# Patient Record
Sex: Male | Born: 1955 | Race: White
Health system: Northeastern US, Academic
[De-identification: ages and names within clinical notes are randomized; demographics above are authoritative.]

## PROBLEM LIST (undated history)

## (undated) DIAGNOSIS — E785 Hyperlipidemia, unspecified: Secondary | ICD-10-CM

## (undated) DIAGNOSIS — I6521 Occlusion and stenosis of right carotid artery: Secondary | ICD-10-CM

## (undated) HISTORY — DX: Occlusion and stenosis of right carotid artery: I65.21

## (undated) HISTORY — DX: Hyperlipidemia, unspecified: E78.5

---

## 2008-01-22 HISTORY — PX: INGUINAL HERNIA REPAIR: SHX194

## 2010-05-22 ENCOUNTER — Ambulatory Visit: Payer: Self-pay | Admitting: Physical Medicine and Rehabilitation

## 2010-05-22 ENCOUNTER — Encounter: Payer: Self-pay | Admitting: Gastroenterology

## 2010-05-22 NOTE — Miscellaneous (Unsigned)
 Continuity of Care Record  Created: todo  From: Roylene Reason  From:   From: [employer], EHR v10.2.7.53  To: Dirr, Travaris  Purpose: Patient Use;       Medications  Fluocinonide-E 0.05 % Cream ; RPT   Lipitor 10 MG Tablet ; RPT   Testosterone Cypionate 200 MG/ML Oil ; RPT   Viagra 100 MG Tablet ; RPT

## 2010-06-04 ENCOUNTER — Ambulatory Visit: Payer: Self-pay | Admitting: Physical Medicine and Rehabilitation

## 2010-06-04 ENCOUNTER — Encounter: Payer: Self-pay | Admitting: Physical Medicine and Rehabilitation

## 2010-06-04 VITALS — BP 133/83 | HR 86 | Ht 68.0 in | Wt 160.0 lb

## 2010-06-04 DIAGNOSIS — M5412 Radiculopathy, cervical region: Secondary | ICD-10-CM

## 2010-06-04 NOTE — Progress Notes (Signed)
 Dictated

## 2010-06-04 NOTE — Letter (Signed)
 Jun 04, 2010        RE:   Alejandro Dennis, Alejandro Dennis  DOB:  [birth year]  Unit#: [id number]    CHIEF COMPLAINT:  Left-sided neck pain and index finger tingling.    HISTORY OF PRESENT ILLNESS:  [name] is a 55 year old gentlemen with a  history of left index finger numbness and tingling that came on  approximately January 21, 2010, without specific precipitating event.  He  describes some pain into the left scapular region and into the neck.  The  pain travels into the shoulder and skips the forearm and goes with numbness  and tingling into the index finger.  Symptoms are worse when he is looking  down, especially when he his driving or working at the computer.  He states  he has been doing pull-ups which also increases pain.  He has had 8 weeks  of physical therapy.  He has tried traction which has been helpful.  Despite therapy, his symptoms still come and go.  He denies any right-sided  symptoms.    PAST MEDICAL HISTORY:  Notable for hypercholesterolemia.    MEDICATIONS:  Lipitor and occasional Advil.    SOCIAL HISTORY/FUNCTIONAL HISTORY:  The patient is married with children.  He is self-employed in [employer].  He does not smoke.  He drinks  occasional alcohol and an occasional cigar 2 to 3 times a year.    FAMILY HISTORY:  Positive for cancer.    REVIEW OF SYSTEMS:  Positive for left neck, arm, and finger pain and  numbness and tingling.    PHYSICAL EXAMINATION:  The patient is 68 inches tall and weighs 160 pounds.  Blood pressure 133/83.  Pulse 86.  Cervical range of motion is functional  in all planes except for reproduction of left cervical radicular symptoms  with flexion.  Spurling maneuver is mildly positive on the left.  Neurological exam is intact with respect to strength and reflexes of both  upper extremities.  There is no atrophy noted.    IMAGING STUDIES:  None available.    IMPRESSION/PLAN:  [name] is a very pleasant 55 year old gentleman with  left cervical radicular symptoms.  He has persistent  symptoms despite an  8-week course of physical therapy.  I would like to further evaluate with  cervical spine x-rays and a cervical MRI at this point in time.  The  patient will follow up with me after imaging.        Sincerely,              Electronically Signed and Finalized by  Alejandro Bellini, MD 06/04/2010 11:46  ____________________________________  Alejandro Bellini, MD        DD:   06/04/2010  DT:   06/04/2010 11:09 A  YN/WG9#[id number]  [id number]    cc:   Jetty Peeks, MD

## 2010-06-25 ENCOUNTER — Encounter: Payer: Self-pay | Admitting: Physical Medicine and Rehabilitation

## 2010-06-25 ENCOUNTER — Ambulatory Visit: Payer: Self-pay | Admitting: Physical Medicine and Rehabilitation

## 2010-06-25 VITALS — BP 151/77 | HR 86 | Ht 68.0 in | Wt 162.0 lb

## 2010-06-25 DIAGNOSIS — M5412 Radiculopathy, cervical region: Secondary | ICD-10-CM

## 2010-06-25 HISTORY — DX: Radiculopathy, cervical region: M54.12

## 2010-06-25 MED ORDER — GABAPENTIN 300 MG PO CAPS
300.0000 mg | ORAL_CAPSULE | Freq: Every evening | ORAL | Status: DC
Start: 2010-06-25 — End: 2011-07-22

## 2010-06-25 NOTE — Progress Notes (Signed)
 Addended by: Renard Matter on: 06/25/2010 09:45 AM     Modules accepted: Level of Service, SmartSet

## 2010-06-25 NOTE — Letter (Signed)
 June 25, 2010        RE:   Alejandro, Dennis  DOB:  09/01/1955  Unit#: [id number]    HISTORY OF PRESENT ILLNESS:  Alejandro Dennis is here today for a follow-up visit  to review cervical spine x-rays and MRI performed since the last visit.    IMAGING STUDIES:  Imaging was notable for multilevel degenerative changes  throughout the cervical spine with anterior and posterior disc bulge  osteophyte complexes, bilateral facet and uncovertebral hypertrophy, and  disc desiccation.  At the C6-C7 level there was left-sided severe neural  foraminal narrowing and severe at C5-C6 and C4-C5.  There was also  right-sided narrowing as well moderate C6-C7, moderate C5-C6, and mild  C4-C5.  Cervical spine radiographs showed mild discogenic disc disease in  the lower cervical spine.    IMPRESSION:  Alejandro Dennis continues to have left index finger tingling.  He  continues to do gym exercises that do irritate his symptoms.  He just takes  over-the-counter Advil.    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  His symptoms flared up when doing an intense  workout routine back in the fall.  I am recommending a revised course of  physical therapy with a cervical stabilization exercise program.  The  patient will also be given a trial of gabapentin 300 mg p.o. q.h.s.  A  follow-up visit will be in 4 to 6 weeks.        Sincerely,              Electronically Signed and Finalized by  Alejandro Bellini, MD 06/25/2010 14:09  ____________________________________  Alejandro Bellini, MD        DD:   06/25/2010  DT:   06/25/2010 11:33 A  ZO/XW9#[id number]  [id number]    cc:   Marylynn Pearson, MD

## 2010-06-25 NOTE — Progress Notes (Signed)
 Dictated

## 2010-07-18 ENCOUNTER — Telehealth: Payer: Self-pay | Admitting: Physical Medicine and Rehabilitation

## 2010-07-18 ENCOUNTER — Encounter: Payer: Self-pay | Admitting: Gastroenterology

## 2010-07-18 MED ORDER — METHYLPREDNISOLONE 4 MG PO KIT *A*
PACK | ORAL | Status: DC
Start: 2010-07-18 — End: 2011-07-22

## 2010-07-18 NOTE — Telephone Encounter (Signed)
Patient with increased cervical radicular pain over weekend with difficulty tolerating PT. Will order Medrol Pak for cervical foraminal stenosis with radiculopathy. Pt to f/u next week

## 2011-07-22 ENCOUNTER — Encounter: Payer: Self-pay | Admitting: Physical Medicine and Rehabilitation

## 2011-07-22 ENCOUNTER — Ambulatory Visit: Payer: Self-pay | Admitting: Physical Medicine and Rehabilitation

## 2011-07-22 VITALS — BP 147/99 | HR 83 | Ht 68.0 in | Wt 162.0 lb

## 2011-07-22 DIAGNOSIS — M5412 Radiculopathy, cervical region: Secondary | ICD-10-CM

## 2011-07-22 NOTE — Progress Notes (Signed)
Chief complaint: Neck and Left Arm Pain    History of present illness:Alejandro Dennis is a 56 y.o. male with complaints of neck and left arm pain in a C6/7 distribution x 1 year.  The patient denies any antecedent trauma or injury.  The pain started up in his upper trapezius gradually began to radiate in to his triceps lateral forearm and into his index finger.  He denies any permanent numbness but has a dysesthetic sensation into the tip of his finger.  Additionally as he was actively exercising at the onset of this did notice some high-level weakness with pull-ups.    Exacerbating factors: flexion, rotation to the left     Relieving factors:massage, stretching his right arm in adduction, advil    Prior treatment:gabapentin - no change,Acupuncture, massage, physical therapy, Flexeril, Advil    Review of Systems:    Constitutional        []  Fever        []  Chills        []  Loss of Appetite         []  Unexplained Weight Loss        []  Unusual Tiredness        []  Night Pains    Ears, Nose, Mouth, Throat        []  Dizziness        []  Mouth Sores        []  Sore Throat    Cardiovascular        []  High Blood                    Pressure        [x]  Hyperlipidemia        []  Trouble              Breathing         []  Trouble Breathing When Flat        []  Ankle Swelling        []  Heart Attack        []  Congestive Heart Failure        []  Mitral Valve Prolapse        []  Abnormal Heart Rhythm        []  Heart Murmur    Respiratory        []  Heavy Cough        []  Cough Up Sputum        []  Cough Up Blood         []  Pneumonia        []  Asthma  Neurological        []  Fainting        []  Epilepsy        []  Stroke         []  Memory Problems  Eyes        []  Abnormal Vision        []  Glasses or Contacts    Gastrointestinal        []  Nausea        []  Stomach Pain        []  Vomiting         []  Vomiting Blood        []  Ulcers        []  Hiatal Hernia        []  Constipation        []  Diarrhea        []  Change in Bowel Habits        []   Blood in  Stool        []  Black, Tarry Stools        []  Reflux        []  GERD    Genitourinary        []  Painful Urination        []  Impotence        []  One Kidney         []  Kidney Failure        []  Dialysis        []  Kidney Transplant        []  Change in Bladder Habits        []  Incontinence        []  Prostate Cancer    Musculoskeletal        []  Painful Joints        []  Swollen Joints        []  Redness of Joints        []  Joint Infection        []  Bone Infection        []  Gout        []  Osteoarthritis        []  Rheumatoid Arthritis        []  Ankylosing Spondylitis        []  Osteoporosis    Skin        []  Skin Sores        []  Skin Rash        []  Itching        []  Skin Cancer  Psychiatric        []  Depression        []  Anxiety    Endocrine        []  Diabetes        []  Thyroid Problem    Hematologic/Lymphatic        []  Unusual Bleeding        []  Easy Bruising        []  Mass        []  Swollen Glands        []  Anemia        []  Infection        []  Immunodeficiency        []  Hepatitis        []  Cancer    Physical Examination:  General:  Pleasant, straightforward, healthy appearing individual.  Mental Status:  Oriented to person, place, and time. Displays appropriate mood and affect.  Gait:  Gait is normal.  Strength Testing:  Bilateral upper  extremity strength is normal and symmetric. No atrophy or tone abnormalities noted.  Sensation:  Normal, No loss of sensation.  Neuro:  Bilateral upper extremity coordination and reflexes are physiologic and symmetric. Negative Hoffman's response bilaterally.  Spine ROM:  He is reduced cervical extension and left rotation with a forward head posture  Peripheral Joint ROM:  Peripheral joint range of motion is full and pain free without obvious instability or laxity in the upper extremities.  Provocative Maneuvers:  Shoulder provocative maneuvers are negative.  Spurling's maneuver and/or Foramen Compression Maneuvers:  Spurling's maneuver is Positive for radicular pain Into his neck and left  scapular region.  Palpation:  Inspection and palpatory examination of the cervical spine, neck, and upper extremities is Remarkable for tenderness of the left upper trapezius and. No lymphadenopathy in the head or neck.  Peripheral Vascular:  Radial pulses are intact. No obvious extremity swellling in the  upper extremities.  Appearance of Skin:  Skin inspection of the neck and upper lower extremities is unremarkable for this issue.  Special Testing:  None.    Diagnostic testing and results:MRI of the cervical spine demonstrates Severe left C7 foraminal narrowing and bilateral C6 foraminal narrowing at severe    Impression: Neck and left arm pain x1 year with bilateral C6 and left C7 foraminal    Plan:  I reviewed the options with the patient in detail.  The patient has undergone an extensive course of conservative care.  He's not improved.  I suggested we proceed with an EMG nerve conduction study left upper extremity to clarify the distribution and degree of reinnervation attending.  Additionally I will have the patient see Dr. Orson Aloe for surgical consultation.  I think he has maximized his conservative care.  He can choose to continue with his advil and give it more time but with the lack of significant progress over the last year I am not sure that this will resolve the issue.

## 2011-08-23 ENCOUNTER — Encounter: Payer: Self-pay | Admitting: Physical Medicine and Rehabilitation

## 2011-08-23 ENCOUNTER — Ambulatory Visit: Payer: Self-pay | Admitting: Physical Medicine and Rehabilitation

## 2011-08-23 ENCOUNTER — Encounter: Payer: Self-pay | Admitting: Gastroenterology

## 2011-08-23 VITALS — BP 154/88 | HR 91 | Ht 68.0 in | Wt 161.0 lb

## 2011-08-23 DIAGNOSIS — M5412 Radiculopathy, cervical region: Secondary | ICD-10-CM

## 2011-08-23 NOTE — Progress Notes (Signed)
The patient was referred and  evaluated with an electrodiagnostic examination. The electrodiagnostic report is located in the Procedures section and under the Media tab of eRecord.

## 2011-08-26 ENCOUNTER — Ambulatory Visit: Payer: Self-pay | Admitting: Orthopedic Surgery

## 2011-11-29 ENCOUNTER — Telehealth: Payer: Self-pay | Admitting: Physical Medicine and Rehabilitation

## 2011-11-29 NOTE — Telephone Encounter (Addendum)
Disregard - patient is cancelling appt with Dr. Loraine Maple office, says he has been going for deep massages pain is better    Patient schd to see dr. Isabell Jarvis on Monday for a surgical consult - mri is over year old.   Need to try and get MRI schd before Monday if possible - need order pls    thanks

## 2011-12-02 ENCOUNTER — Ambulatory Visit: Payer: Self-pay | Admitting: Orthopedic Surgery

## 2013-01-12 ENCOUNTER — Other Ambulatory Visit: Payer: Self-pay | Admitting: Gastroenterology

## 2013-03-05 DIAGNOSIS — R9431 Abnormal electrocardiogram [ECG] [EKG]: Secondary | ICD-10-CM | POA: Insufficient documentation

## 2013-04-07 ENCOUNTER — Encounter: Payer: Self-pay | Admitting: Gastroenterology

## 2014-01-07 ENCOUNTER — Other Ambulatory Visit: Payer: Self-pay | Admitting: Pediatrics

## 2014-01-07 LAB — COMPREHENSIVE METABOLIC PANEL
ALT: 43 U/L^U/L (ref 12–78)
AST: 29 U/L^U/L (ref 11–37)
Albumin: 3.9 g/dL^g/dL (ref 3.4–5.0)
Alk Phos: 26 U/L^U/L — ABNORMAL LOW (ref 45–117)
Anion Gap: 5 (ref 3–11)
Bilirubin,Total: 0.9 mg/dL^mg/dL (ref 0.2–1.0)
CO2: 30 meq/L^meq/L (ref 21–32)
Calcium: 8.2 mg/dL^mg/dL — ABNORMAL LOW (ref 8.5–10.1)
Chloride: 103 meq/L^meq/L (ref 98–107)
Creatinine: 1.1 mg/dL^mg/dL (ref 0.6–1.3)
GFR,Black: 83 mL/min/{1.73_m2}
GFR,Caucasian: 69 mL/min/{1.73_m2}
Glucose: 79 mg/dL^mg/dL (ref 70–100)
Lab: 15 mg/dL^mg/dL (ref 7–18)
Potassium: 4.3 meq/L^meq/L (ref 3.5–5.1)
Sodium: 138 meq/L^meq/L (ref 136–145)
Total Protein: 7.1 g/dL^g/dL (ref 6.4–8.2)

## 2014-01-07 LAB — LIPID PANEL
Chol/HDL Ratio: 3.1 (ref 1.0–3.5)
Cholesterol: 181 mg/dL^mg/dL (ref 0–199)
HDL: 59 mg/dL^mg/dL — ABNORMAL HIGH (ref 40–50)
LDL Calculated: 96 mg/dL^mg/dL (ref 0–129)
Triglycerides: 130 mg/dL^mg/dL (ref 0–149)

## 2014-01-10 LAB — VITAMIN D
25-OH VIT D2: <4 ng/mL^ng/mL
25-OH VIT D3: 22 ng/mL^ng/mL
25-OH Vit Total: 22 ng/mL^ng/mL — ABNORMAL LOW (ref 30–60)

## 2014-01-10 LAB — TESTOSTERONE, FREE, TOTAL
Testosterone,% Free: 2 %^%
Testosterone,Free: 298 pg/mL^pg/mL — ABNORMAL HIGH (ref 47–244)
Testosterone: 1208 ng/dL^ng/dL — ABNORMAL HIGH (ref 193–740)

## 2014-05-04 ENCOUNTER — Other Ambulatory Visit: Payer: Self-pay | Admitting: Urgent Care

## 2014-05-04 ENCOUNTER — Ambulatory Visit
Admit: 2014-05-04 | Discharge: 2014-05-04 | Disposition: A | Discharge status: Home / Self Care | Payer: Self-pay | Source: Ambulatory Visit | Attending: Urgent Care | Admitting: Urgent Care

## 2014-05-04 DIAGNOSIS — M79609 Pain in unspecified limb: Secondary | ICD-10-CM

## 2014-06-07 ENCOUNTER — Ambulatory Visit: Payer: Self-pay | Admitting: Orthopedic Surgery

## 2014-06-14 ENCOUNTER — Ambulatory Visit: Payer: Self-pay | Admitting: Orthopedic Surgery

## 2014-06-14 ENCOUNTER — Encounter: Payer: Self-pay | Admitting: Orthopedic Surgery

## 2014-06-14 VITALS — BP 148/91 | Ht 68.0 in | Wt 157.0 lb

## 2014-06-14 DIAGNOSIS — S63619A Unspecified sprain of unspecified finger, initial encounter: Secondary | ICD-10-CM

## 2014-06-14 NOTE — Progress Notes (Signed)
This office note has been dictated.  [id number]

## 2014-06-14 NOTE — Progress Notes (Signed)
[name]:   Boedecker, Ayren  MR #:  [id number]   ACCOUNT #:  [id number] DOB:  September 10, 1955   DICTATED BY:  Chauncey Mannonald Oseias Horsey, DO DATE OF VISIT:  06/14/2014     Here today for his right hand.  This is a 59 year old male who hurt himself who is self-employed, right-hand-dominant who is currently working with no light duty restrictions.  On 04/25/2014 he tripped and fell onto the side of a truck.  He has had some right hand ring finger knuckle and outside of the right hand pain.  The patient feels some instability in that knuckle and when he shakes it feels like it does not stay stable.  He has been put in a cast for a little bit which he took on and off.  It seems more like a splint.  He did start buddy taping approximately 2 weeks ago which has helped some.  He has taken some Advil for his pain.  He has not done any physical therapy.  He has had an x-ray which looks normal.  He has no other complaints at this time.  He denies any numbness or tingling.  He denies any other problems.    REVIEW OF SYSTEMS:  Negative except for the HPI.     Medications, allergies, past medical, surgical, social, and family history have been updated in chart and discussed with the patient.    PHYSICAL EXAMINATION:  Vital signs are stable.  He is afebrile.  Affect is normal.  Skin is normal.  Pulses are normal.  General appearance is fine.  Examination of his right hand demonstrates no erythema, ecchymosis, or edema.  Full range of motion of his fingers.  Pain over the ulnar side of the MCP.  Some instability with stressing of the ulnar side of the MCP.  PIP and DIP are normal.    IMAGING:  X-rays demonstrate no fracture, no instability of the joint, and well aligned.    ASSESSMENT:  Ulnar collateral ligament strain of the right ring finger.    PLAN:  At this point, we are going to buddy tape him for another 4 weeks.  I have explained to him the anatomy and pathophysiology of the injury.  Possibility I may have to obtain an MRI and possible repair  at a later date.  Hopefully, this will be taken care of by buddy taping.  I explained him that I wanted to buddy tape him nonstop for the next 3 weeks.  He asked about strengthening.  We  will worry about that as the time comes.  He understands this.  He will follow up with me in approximately 4 weeks to see how he is doing.             ______________________________  Chauncey Mannonald Jaqualyn Juday, DO    RG/MODL  DD:  06/14/2014 09:09:18  DT:  06/14/2014 13:19:03  Job #:  [id number]    cc:

## 2014-07-21 ENCOUNTER — Encounter: Payer: Self-pay | Admitting: Orthopedic Surgery

## 2014-07-21 ENCOUNTER — Ambulatory Visit: Payer: Self-pay | Admitting: Orthopedic Surgery

## 2014-07-21 VITALS — BP 142/68 | Ht 68.0 in | Wt 154.0 lb

## 2014-07-21 DIAGNOSIS — S63659D Sprain of metacarpophalangeal joint of unspecified finger, subsequent encounter: Secondary | ICD-10-CM

## 2014-07-21 NOTE — Progress Notes (Signed)
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  He has utilized buddy tape diligently since our initial last visit 3 weeks ago.      PHYSICAL EXAM  Vital Signs:   Visit Vitals    BP 142/68    Ht 1.727 m (5\' 8" )    Wt 69.9 kg (154 lb)  Comment: verbal    BMI 23.42 kg/m2      General Appearance:  Normal body habitus. Alert and oriented to person, place, and time.  Affect:  Normal.  Pulses - Normal  Skin- Normal  He is well.  On exam of the right had there is little pain over the ulnar MCP of ring finger.  There is no instability with gentle stressing of the collaterals.  He is tender today over A1 pulley.  Neurovascular exam is unremarkable.      Radiographs of right hand show no acute process    IMPRESSION/PLAn  Right ring finger trigger  Right ulnar collateral ligament sprain at MCP; improving   The natural course of triggering digits with MCP sprain is understood.  Treatment options are explored and include ongoing observation, cortisone injections or surgery.  They prefer cortisone injection that will be administered today.  The risk, benefits, and alternatives to injections were discussed with the patient. Risks include but are not limited to pain, stiffness, allergic reaction, fat atrophy, depigmentation, and infection.  Injections do not always cure triggers and repeat injection or surgery is sometimes needed.  They are receptive to therapy.  We will see them back in 6 to 8 weeks    PROCEDURE NOTE:   Risks, benefits and alternatives to injection are explained; including atrophy, hypopigmentation, pain and bruising.  After verbal and visual confirmation of the injection site, my initials were placed and a time out was performed at 08:30. Timeout participants were myself and the patient. Two patient identifier's were used. The correct procedure, level, positioning, equipment and site  was confirmed.  Appropriate hand hygiene and alcohol-based prep was used. Ethyl chloride is offered. Using this proper technique, 1/2 cc lidocaine 1% plain with 1/2 cc 40mg  depomedrol is injected into right ring finger .  Patient tolerated this well and left stable.      ORDER NEXT VISIT:    Assess response to therapy and injection

## 2014-08-05 ENCOUNTER — Encounter: Payer: Self-pay | Admitting: Gastroenterology

## 2014-08-16 ENCOUNTER — Other Ambulatory Visit: Payer: Self-pay | Admitting: Orthopedic Surgery

## 2014-08-16 ENCOUNTER — Telehealth: Payer: Self-pay | Admitting: Orthopedic Surgery

## 2014-08-16 DIAGNOSIS — S63659D Sprain of metacarpophalangeal joint of unspecified finger, subsequent encounter: Secondary | ICD-10-CM

## 2014-08-16 NOTE — Telephone Encounter (Signed)
[facility] is requesting an updated script, can you put one in for him? Thanks

## 2014-08-18 NOTE — Telephone Encounter (Signed)
Faxed

## 2014-09-01 ENCOUNTER — Ambulatory Visit: Payer: Self-pay | Admitting: Orthopedic Surgery

## 2014-09-22 ENCOUNTER — Ambulatory Visit: Payer: Self-pay | Admitting: Orthopedic Surgery

## 2014-11-15 ENCOUNTER — Telehealth: Payer: Self-pay

## 2014-11-15 ENCOUNTER — Other Ambulatory Visit: Payer: Self-pay | Admitting: Cardiology

## 2014-11-15 DIAGNOSIS — R0989 Other specified symptoms and signs involving the circulatory and respiratory systems: Secondary | ICD-10-CM

## 2014-11-15 NOTE — Telephone Encounter (Signed)
I have ordered the carotid Doppler study

## 2014-11-15 NOTE — Telephone Encounter (Signed)
-----   Message from EndicottShauna Davis sent at 11/15/2014 12:28 PM EDT -----  Contact: [phone]  Pt states [facility] was ordering a carotid Doppler study this year. Need to make sure this is wanted and need order.

## 2014-11-15 NOTE — Telephone Encounter (Signed)
Please advise 

## 2014-11-16 NOTE — Telephone Encounter (Signed)
lmom for patient to schedule vascular ultrasound. Asking patient if he prefers [place] to set up appt with him or if he would like to call and set up based on his schedule with vascular

## 2014-11-16 NOTE — Telephone Encounter (Signed)
Pt returned call. Wished to call vascular to set up appt based on his schedule. Give him phone # for vascular. Done

## 2014-11-17 ENCOUNTER — Other Ambulatory Visit: Payer: Self-pay

## 2014-11-17 DIAGNOSIS — I779 Disorder of arteries and arterioles, unspecified: Secondary | ICD-10-CM

## 2014-11-25 ENCOUNTER — Ambulatory Visit
Admit: 2014-11-25 | Discharge: 2014-11-25 | Disposition: A | Discharge status: Home / Self Care | Payer: Self-pay | Admitting: Vascular Surgery

## 2014-11-25 ENCOUNTER — Other Ambulatory Visit: Payer: Self-pay | Admitting: Gastroenterology

## 2014-11-25 DIAGNOSIS — I779 Disorder of arteries and arterioles, unspecified: Secondary | ICD-10-CM

## 2014-11-28 LAB — CV US CAROTID BILATERAL
Left Carotid Bulb EDV: 20.8 cm/s
Left Carotid Bulb PSV: 98.7 cm/s
Left Common Carotid Artery EDV Dist: 23.4 cm/s
Left Common Carotid Artery EDV Prox: 13.9 cm/s
Left Common Carotid Artery PSV Dist: 78 cm/s
Left Common Carotid Artery PSV Prox: 73.6 cm/s
Left External Carotid Artery PSV: -122 cm/s
Left ICA/CCA Ratio: -1.1
Left Internal Carotid Artery EDV Dist: -26 cm/s
Left Internal Carotid Artery EDV Mid: -25.1 cm/s
Left Internal Carotid Artery EDV Prox: -25.1 cm/s
Left Internal Carotid Artery PSV Dist: -58.9 cm/s
Left Internal Carotid Artery PSV Mid: -59.8 cm/s
Left Internal Carotid Artery PSV Prox: -85.7 cm/s
Left Subclavian Artery PSV: 122 cm/s
Left Vertebral Artery EDV: 8.9 cm/s
Left Vertebral Artery PSV: 32.9 cm/s
Right Carotid Bulb EDV: 21.3 cm/s
Right Carotid Bulb PSV: 77 cm/s
Right Common Carotid Artery EDV Dist: 17.2 cm/s
Right Common Carotid Artery EDV Prox: 22.7 cm/s
Right Common Carotid Artery PSV Dist: 63.2 cm/s
Right Common Carotid Artery PSV Prox: 85.2 cm/s
Right External Carotid Artery PSV: -83.2 cm/s
Right ICA/CCA Ratio: -3.73
Right Internal Carotid Artery EDV Dist: -24.3 cm/s
Right Internal Carotid Artery EDV Mid: -43.3 cm/s
Right Internal Carotid Artery EDV Prox: -83.1 cm/s
Right Internal Carotid Artery PSV Dist: -66.7 cm/s
Right Internal Carotid Artery PSV Mid: -185 cm/s
Right Internal Carotid Artery PSV Prox: -236 cm/s
Right Subclavian Artery PSV: 143 cm/s
Right Vertebral Artery EDV: 13.9 cm/s
Right Vertebral Artery PSV: 56.3 cm/s

## 2014-12-05 ENCOUNTER — Telehealth: Payer: Self-pay | Admitting: Cardiology

## 2014-12-06 ENCOUNTER — Other Ambulatory Visit: Payer: Self-pay | Admitting: Pediatrics

## 2014-12-06 LAB — LIPID PANEL
Chol/HDL Ratio: 2.4 (ref 1.0–3.5)
Cholesterol: 191 mg/dL^mg/dL (ref 0–199)
HDL: 78 mg/dL^mg/dL — ABNORMAL HIGH (ref 40–50)
LDL Calculated: 97 mg/dL^mg/dL (ref 0–129)
Triglycerides: 81 mg/dL^mg/dL (ref 0–149)

## 2014-12-06 LAB — COMPREHENSIVE METABOLIC PANEL
ALT: 51 U/L^U/L (ref 12–78)
AST: 33 U/L^U/L (ref 11–37)
Albumin: 4 g/dL^g/dL (ref 3.4–5.0)
Alk Phos: 30 U/L^U/L — ABNORMAL LOW (ref 45–117)
Anion Gap: 6 (ref 3–11)
Bilirubin,Total: 0.9 mg/dL^mg/dL (ref 0.2–1.0)
CO2: 31 meq/L^meq/L (ref 21–32)
Calcium: 8.8 mg/dL^mg/dL (ref 8.5–10.1)
Chloride: 102 meq/L^meq/L (ref 98–107)
Creatinine: 1.1 mg/dL^mg/dL (ref 0.6–1.3)
GFR,Black: 83 mL/min/{1.73_m2}
GFR,Caucasian: 69 mL/min/{1.73_m2}
Glucose: 83 mg/dL^mg/dL (ref 70–100)
Lab: 29 mg/dL^mg/dL — ABNORMAL HIGH (ref 7–18)
Potassium: 4.2 meq/L^meq/L (ref 3.5–5.1)
Sodium: 139 meq/L^meq/L (ref 136–145)
Total Protein: 7.1 g/dL^g/dL (ref 6.4–8.2)

## 2014-12-06 NOTE — Telephone Encounter (Signed)
S/w pt - gave him message from DR Smith Robertao about carotid ultrasound results / he did not f/u with Vascular ( wasn't sure whom he was suppose to call )  I gave him Dr Elwyn LadeStoner 's name & office no - pt is going to call this AM & make an appt.  Pt wonders if he needs to make an appt with DR Smith Robertao too - please advise about appt with you

## 2014-12-06 NOTE — Telephone Encounter (Signed)
Please schedule a follow-up appointment in 2-3 weeks

## 2014-12-06 NOTE — Telephone Encounter (Signed)
Called this number and it states another persons name. I called a home number listed in chart and mailbox is full. I have mailed an appointment out to patient. =- DONE

## 2014-12-06 NOTE — Telephone Encounter (Signed)
    Randa Evensao, Krishna M, MD  [facility] Rn Team 11 hours ago (7:24 PM)              Please call with mildly abnormal carotid ultrasound study   Make sure he has an appointment with vascular surgery for follow-up (Routing comment)

## 2014-12-07 ENCOUNTER — Telehealth: Payer: Self-pay

## 2014-12-07 LAB — TESTOSTERONE, FREE, TOTAL
Testosterone,% Free: 1 %^%
Testosterone,Free: 10 pg/mL^pg/mL — ABNORMAL LOW (ref 47–244)
Testosterone: 82 ng/dL^ng/dL — ABNORMAL LOW (ref 193–740)

## 2014-12-07 NOTE — Telephone Encounter (Signed)
Please have someone else in the vascular surgery see him before that time.  Thanks

## 2014-12-07 NOTE — Telephone Encounter (Signed)
Called Vascular scheduled with Dr. Hassan Buckler for 12/09/14  9:00am  // done

## 2014-12-07 NOTE — Telephone Encounter (Signed)
-----   [facility] sent at 12/07/2014  9:29 AM EST -----  Contact: [phone]  Per Dr. Smith Alejandro Dennis patient was told to make an appt with Dr. Elwyn LadeStoner Patient made  an appt with Dr Elwyn LadeStoner his soonest is  Dec 20th patient needs to know if there is someone else that he can see sooner or is it okay for him to wait that long.

## 2014-12-07 NOTE — Telephone Encounter (Signed)
Please advise 

## 2014-12-07 NOTE — Telephone Encounter (Signed)
Can you please get pt in sooner with another vascular doc?

## 2014-12-09 ENCOUNTER — Ambulatory Visit: Payer: Self-pay | Admitting: Vascular Surgery

## 2014-12-09 ENCOUNTER — Encounter: Payer: Self-pay | Admitting: Vascular Surgery

## 2014-12-09 VITALS — BP 132/80 | Ht 68.0 in | Wt 152.0 lb

## 2014-12-09 DIAGNOSIS — I6521 Occlusion and stenosis of right carotid artery: Secondary | ICD-10-CM

## 2014-12-09 NOTE — Progress Notes (Signed)
[facility]    Chief Complaint:   Asymptomatic R carotid artery stenosis.    HPI:   Alejandro Dennis is a 59 y.o. male referred by PCP for asymptomatic carotid stenosis.  Denies dysarthria, amaurosis fugax, hemiparesis, hemiplegia.  He is very active and runs 25-30 miles/week.  Denies claudication/rest pain/tissue loss. Takes ASA and does not smoke.         Vascular risk factors:   Age: 59 y.o.  Male gender: yes  Diabetes mellitus: no  Obesity: no, Body mass index is 23.11 kg/(m^2).  Hypertension: yes  Hyperlipidemia: yes  Tobacco abuse: no  Family history: yes  VQI Info:  Occupation: Unknown  Ambulatory status: Ambulates  Current living status: Stable, lives at home  HCP: Unknown  Code status: Unknown Comorbidities:  Coronary artery disease: no  Renal disease: no  Lung disease: no  Arrhythmia: no  Prior CVA: no  CHF: no  Prior PCI: no  Prior open heart surgery: no  Liver disease: no  Coagulopathy: no     REVIEW OF SYSTEMS  ROS    MEDICAL HISTORY  No past medical history on file.    SURGICAL HISTORY  No past surgical history on file.    FAMILY HISTORY  No family history on file.     SOCIAL HISTORY   reports that he has never smoked. He does not have any smokeless tobacco history on file.     ALLERGIES  No known latex allergy     MEDICATIONS  Current Outpatient Prescriptions   Medication Sig    aspirin 81 MG EC tablet Take 81 mg by mouth daily    ibuprofen (ADVIL,MOTRIN) 200 MG tablet Take 200 mg by mouth as needed   Takes 3 pills at a time    atorvastatin (LIPITOR) 10 MG tablet Take 10 mg by mouth every other day         No current facility-administered medications for this visit.         Objective:      Vitals:    12/09/14 0902   BP: 132/80   Weight: 68.9 kg (152 lb)   Height: 1.727 m (5\' 8" )         IMAGING:  Carotid duplex:    No significant left internal carotid artery stenosis (1-15%).   Moderate right internal carotid artery stenosis (50-79%).   Antegrade bilateral vertebral artery flow.    Physical Exam:      General appearance: healthy, alert, active, no distress and cooperative  HEENT: PERRLA  CV: regular rate and rhythm, S1, S2 normal, no murmur, click, rub or gallop  Lungs: clear  Abd: soft, non-tender. Bowel sounds normal. No masses,  no organomegaly  Extremities:  R femoral pulse palpable  R DP palpable  R PT palpable  Cap refill 2 sec  Motor/sensory intact      L femoral pulse palpable  L DP palpable  L PT palpable  Cap refill 2 sec  Motor/sensory intact    Neuro: normal without focal findings, mental status, speech normal, alert and oriented x3, PERLA and reflexes normal and symmetric    Assessment:   Alejandro Dennis is a 59 y.o. male with asymptomatic 60-70% R ICA and <50% L ICA stenosis.      Plan:     1. F/u 6 months with repeat carotid US.  If velocities remain stable, will go to yearly intervals.  2. Continue ASA/statin  3. Signs and symptoms of TIA/CVA were reviewed  with the patient who expressed understanding.

## 2014-12-12 LAB — VITAMIN D
25-OH VIT D2: <4 ng/mL^ng/mL
25-OH VIT D3: 46 ng/mL^ng/mL
25-OH Vit Total: 46 ng/mL^ng/mL (ref 30–60)

## 2014-12-29 LAB — SEX HORMONE BINDING GLOBULIN: Sex Hormone Binding Glob: 60 nmol/L^nmol/L (ref 10–80)

## 2015-01-02 ENCOUNTER — Encounter: Payer: Self-pay | Admitting: Cardiology

## 2015-01-02 ENCOUNTER — Ambulatory Visit: Payer: Self-pay | Admitting: Cardiology

## 2015-01-02 VITALS — BP 120/82 | HR 60 | Ht 68.0 in | Wt 160.0 lb

## 2015-01-02 DIAGNOSIS — E785 Hyperlipidemia, unspecified: Secondary | ICD-10-CM

## 2015-01-02 NOTE — Progress Notes (Signed)
Cardiology Office Revisit Note    Date of Visit: 01/02/2015 Patient: Onalee Hua Uher   Patients PCP: Jetty Peeks, MD Patient DOB: 1955/05/14     Subjective/Reason For Visit     I had the pleasure of seeing Gottlieb Zuercher in cardiology followup on 01/02/2015     Teague is here for cardiovascular prevention as he has the coronary risk factors of hyperlipidemia and previously documented to have carotid vessel disease.  He had a follow-up carotid ultrasound recently in October and it showed 50-75% stenosis of the right internal carotid artery.  I had him see vascular surgery in follow-up other.  He reports that he is doing well otherwise.  He denied chest pain, dyspnea, palpitation, lightheadedness and syncope.  He tends to run consistently for 5 miles but none since Thanksgiving because of cold weather.        .ROS  Medications     Current Outpatient Prescriptions   Medication Sig    aspirin 81 MG EC tablet Take 81 mg by mouth daily    ibuprofen (ADVIL,MOTRIN) 200 MG tablet Take 200 mg by mouth as needed   Takes 3 pills at a time    atorvastatin (LIPITOR) 10 MG tablet Take 10 mg by mouth every other day         Vitals and Physical Exam     Alvester's  height is 1.727 m ( ) and weight is 72.6 kg (160 lb). His blood pressure is 120/82 and his pulse is 60.  Body mass index is 24.33 kg/(m^2).    Physical Exam     Vital signs reviewed and stable as above  The patient appeared comfortable in no acute distress.  HEENT with normal carotid upstroke and normal jugular venous pressure.  There are no carotid bruits or transmitted murmurs.  There is no conjunctival pallor or icterus.  There is no thyromegaly  Cardiac examination with regular sounds and nondisplaced PMI.  There are no murmurs or gallops audible.    Examination of the lungs revealed adequate air entry bilaterally with no wheezing or rales.  Abdominal examination limited.  Vascular examination revealed good dorsalis pedis and posterior tibial bilaterally 3+.   Both radial pulses are good 3+  Extremities revealed no cyanosis or clubbing.  Skin examination showed no areas of ecchymosis or bruises or rash.  Neurological examination is grossly intact with no focal deficits.  Gait is normal            Laboratory Data     Hematology:   No results found for requested labs within last 365 days.     Chemistry:   Results in Past 365 Days  Result Component Current Result Previous Result   Sodium 139 (12/06/2014) 138 (01/07/2014)   Potassium 4.2 (12/06/2014) 4.3 (01/07/2014)   Creatinine 1.1 (12/06/2014) 1.1 (01/07/2014)   Glucose 83 (12/06/2014) 79 (01/07/2014)   Calcium 8.8 (12/06/2014) 8.2 (L) (01/07/2014)   AST 33 (12/06/2014) 29 (01/07/2014)   ALT 51 (12/06/2014) 43 (01/07/2014)     Coagulation Studies:   No results found for requested labs within last 365 days.     Cardiac:   No results found for requested labs within last 365 days.     Lipids:   Results in Past 365 Days  Result Component Current Result Previous Result   Cholesterol 191 (12/06/2014) 181 (01/07/2014)   HDL 78 (H) (12/06/2014) 59 (H) (01/07/2014)   Triglycerides 81 (12/06/2014) 130 (01/07/2014)   LDL Calculated 97 (12/06/2014) 96 (01/07/2014)  Cardiac/Imaging Data     ECG:                   Impression and Plan     Patient Active Problem List   Diagnosis Code    Cervical radiculopathy M54.12       This is an 59 y.o. male with moderate right internal carotid artery stenosis otherwise asymptomatic from cardiac standpoint a few.  He was seen by vascular surgery and they're following him with a repeat ultrasound examination of the carotids in 6 months  They have recommended continuation of aspirin and statins.  He is doing well from cardiac standpoint a few and is asymptomatic.  Would like to address coronary risk factors rather aggressively given documented carotid vessel disease.  I have reviewed the carotid ultrasound results with him in detail.  There is moderate plaquing involving right carotid bulb  essentially unchanged compared to study from 2 years ago cheeses reassuring.  He would continue aspirin and statins at current doses.  I reviewed his lipid profile which is excellent although LDL could be improved to below 70.  His blood pressure is well-controlled.  I've asked him to be prudent with low cholesterol diet and exercise program.    I will see him in follow-up in one and half years.    Thank you for allowing me to participate in the care of this pleasant patient.         Jess BartersKRISHNA M Ramonita Koenig, MD  Electronically signed on 01/02/2015 at 8:30 AM.

## 2015-01-10 ENCOUNTER — Ambulatory Visit: Payer: Self-pay | Admitting: Vascular Surgery

## 2015-06-15 ENCOUNTER — Other Ambulatory Visit: Payer: Self-pay | Admitting: Vascular Surgery

## 2015-06-15 ENCOUNTER — Other Ambulatory Visit: Payer: Self-pay

## 2015-06-16 ENCOUNTER — Ambulatory Visit: Payer: Self-pay

## 2015-06-16 ENCOUNTER — Telehealth: Payer: Self-pay

## 2015-06-16 ENCOUNTER — Encounter: Payer: Self-pay | Admitting: Vascular Surgery

## 2015-06-16 ENCOUNTER — Ambulatory Visit
Admission: RE | Admit: 2015-06-16 | Discharge: 2015-06-16 | Disposition: A | Discharge status: Home / Self Care | Payer: Self-pay | Source: Ambulatory Visit | Attending: Vascular Surgery | Admitting: Vascular Surgery

## 2015-06-16 ENCOUNTER — Ambulatory Visit: Payer: Self-pay | Admitting: Vascular Surgery

## 2015-06-16 VITALS — BP 120/60 | Ht 68.0 in | Wt 172.6 lb

## 2015-06-16 DIAGNOSIS — I6521 Occlusion and stenosis of right carotid artery: Secondary | ICD-10-CM

## 2015-06-16 NOTE — Telephone Encounter (Signed)
Upon your return...    Spoke with patient. Patient has concerns he would like discuss with you regarding upcoming surgery. Please call upon your return. Thanks.

## 2015-06-16 NOTE — Progress Notes (Signed)
Vascular Surgery Outpatient Clinic Follow-Up Visit    HPI:     Alejandro Dennis is a 60 y.o. male with PMH significant for HLD and cervical spine stenosis, who is seen in the Vascular Surgery outpatient clinic for routine follow-up regarding asymptomatic right carotid artery stenosis. Patient denies any interval symptoms concerning for stroke, TIA, or amaurosis fugax. He notes intermittent left arm/hand numbness which he attributes to cervical spine disease, symptoms relieved with change in position. He continues to remain quite active, running 2-3 miles per day. Per patient report, he has been off ASA since time of last visit with cardiologist.    Patient's medications, allergies, and medical, surgical, family, and social histories were updated, as appropriate, in eRecord during today's office visit.    Vascular risk factors:   Age: 60 y.o.  Male gender: yes  Diabetes mellitus: no  Obesity: no, Body mass index is 26.24 kg/(m^2).  Hypertension: no  Hyperlipidemia: yes  Tobacco abuse: no  Family history: yes  VQI Info:  Occupation: Self-Employed  Ambulatory status: Independent  Current living status: Home  HCP: Unknown  Code status: Full Comorbidities:  Coronary artery disease: no  Renal disease: no  Lung disease: no  Arrhythmia: no  Prior CVA: no  CHF: no  Prior PCI: no  Prior open heart surgery: no  Liver disease: no  Coagulopathy: no     REVIEW OF SYSTEMS  ROS - Pertinent items noted in HPI.    MEDICAL HISTORY  Past Medical History:   Diagnosis Date    Carotid artery stenosis, asymptomatic, right     HLD (hyperlipidemia)        SURGICAL HISTORY  Past Surgical History:   Procedure Laterality Date    INGUINAL HERNIA REPAIR Right        FAMILY HISTORY  Family History   Problem Relation Age of Onset    Other Father      Carotid artery stenosis s/p BCEA    Lung cancer Father     Stroke Neg Hx     Heart attack Neg Hx     Heart surgery Neg Hx     Diabetes Neg Hx         SOCIAL HISTORY   reports that he has never  smoked. He does not have any smokeless tobacco history on file.     ALLERGIES  Ampicillin and No known latex allergy     MEDICATIONS  Current Outpatient Prescriptions   Medication Sig    ibuprofen (ADVIL,MOTRIN) 200 MG tablet Take 200 mg by mouth as needed   Takes 3 pills at a time    atorvastatin (LIPITOR) 10 MG tablet Take 10 mg by mouth every other day        aspirin 81 MG EC tablet Take 81 mg by mouth daily     No current facility-administered medications for this visit.         Objective:      BP 120/60 Comment: right arm  Ht 1.727 m ( )  Wt 78.3 kg (172 lb 9.6 oz)  BMI 26.24 kg/m2     Imaging:  Right internal carotid artery (ICA)  Severe right internal carotid artery stenosis (80-99%).   Right vertebral artery is antegrade.    Left internal carotid artery (ICA)  No significant left internal carotid artery stenosis (1-15%).   Left vertebral artery is antegrade.    Physical Exam:      General appearance: healthy, alert, active and no distress  HEENT: Normocephalic,  atraumatic  CV: regular rate and rhythm and no carotid bruit  Lungs: CTA bilaterally, respirations unlabored  Extremities: Bilateral lower extremities are warm and well perfused without any evidence of clubbing, cyanosis, or limb ischemia. Intact peripheral pulses. No edema.   Pulses:  Right Carotid: Normal  Left Carotid: Normal  Right Radial: Normal  Left Radial: Normal  Right Ulnar: Normal  Left Ulnar: Normal  Right Posterior Tibial: Normal  Left Posterior Tibial: Normal  Neuro: normal without focal findings, mental status, speech normal, alert and oriented x3 and sensation grossly normal    Assessment:   Alejandro Dennis is a 60 y.o. male critical right ICA stenosis, asymptomatic.    Plan:      Results of the ultrasound studies were reviewed with the patient - RICA stenosis >80%.   Recommend right carotid endarterectomy for stroke risk reduction.   Risks, benefits, and alternatives to procedure reviewed with patient, he wishes to  proceed.   Informed consent obtained, will be scanned into chart.   Pre-operative testing: dobutamine stress echo ordered.   Resume daily antiplatelet therapy, continue statin.   Seek immediate medical attention with symptoms concerning for stroke/TIA.   Phone clinic with any questions/concerns prior to procedure.    Patient seen and plan formulated with Dr. Aundria MemsKathleen Raman.    Azzie RoupElizabeth A Wight-Regna, NP  06/16/2015 at 9:19 AM

## 2015-06-16 NOTE — Telephone Encounter (Signed)
-----   Message from Simeon CraftJanet Pontarelli sent at 06/16/2015 10:46 AM EDT -----  Contact: [phone]  Name of caller: Theodoro Gristave  Relationship:  Provider's name:  Dr. Dionicia Ableraman  Facility name: [facility]  Phone number: [phone]  Fax number:  Date of surgery:  Not yet scheduled  Name of procedure: Carotid surg    Cardiac clearance (    )  Anticoagulation instructions  (   )  Both  (   )  Other comments:  Pt saw vascular today for carotid US and surgery has been recommended.  Pt is not feeling comfortable with the surgeon and would like to have Dr. Smith Robertao call him and either reassure him or recommend a second opinion.

## 2015-06-18 LAB — CV US CAROTID BILATERAL
Left Carotid Bulb EDV: 20.83 cm/s
Left Carotid Bulb PSV: 85.44 cm/s
Left Common Carotid Artery EDV Dist: 24.06 cm/s
Left Common Carotid Artery EDV Prox: 27.38 cm/s
Left Common Carotid Artery PSV Dist: 74.13 cm/s
Left Common Carotid Artery PSV Prox: 84.5 cm/s
Left External Carotid Artery EDV: 15.99 cm/s
Left External Carotid Artery PSV: 93.51 cm/s
Left ICA/CCA Ratio: 1.07
Left Internal Carotid Artery EDV Dist: 23.18 cm/s
Left Internal Carotid Artery EDV Mid: 30.52 cm/s
Left Internal Carotid Artery EDV Prox: 27.29 cm/s
Left Internal Carotid Artery PSV Dist: 55.55 cm/s
Left Internal Carotid Artery PSV Mid: 67.67 cm/s
Left Internal Carotid Artery PSV Prox: 78.98 cm/s
Left Subclavian Artery EDV: 0 cm/s
Left Subclavian Artery PSV: 132.57 cm/s
Left Vertebral Artery EDV: 12.84 cm/s
Left Vertebral Artery PSV: 38.79 cm/s
Right Carotid Bulb EDV: 18.57 cm/s
Right Carotid Bulb PSV: 80.09 cm/s
Right Common Carotid Artery EDV Dist: 16.37 cm/s
Right Common Carotid Artery EDV Prox: 18.57 cm/s
Right Common Carotid Artery PSV Dist: 54.82 cm/s
Right Common Carotid Artery PSV Prox: 72.4 cm/s
Right External Carotid Artery EDV: 19.22 cm/s
Right External Carotid Artery PSV: 112.89 cm/s
Right ICA/CCA Ratio: 6.62
Right Internal Carotid Artery EDV Dist: 16.7 cm/s
Right Internal Carotid Artery EDV Mid: 47.1 cm/s
Right Internal Carotid Artery EDV Prox: 140.4 cm/s
Right Internal Carotid Artery PSV Dist: 49.08 cm/s
Right Internal Carotid Artery PSV Mid: 133.49 cm/s
Right Internal Carotid Artery PSV Prox: 363.04 cm/s
Right Subclavian Artery EDV: 0 cm/s
Right Subclavian Artery PSV: 137.67 cm/s
Right Vertebral Artery EDV: 15.62 cm/s
Right Vertebral Artery PSV: 47.9 cm/s

## 2015-06-20 ENCOUNTER — Telehealth: Payer: Self-pay | Admitting: Registered Nurse

## 2015-06-20 NOTE — Telephone Encounter (Signed)
Please schedule appointment with Dr.Stoner for next week. Thanks.

## 2015-06-20 NOTE — Telephone Encounter (Signed)
Spoke with pt. Provided scheduling number at [facility] for pt to call and schedule appt for second opinion with Dr.Stoner. Pt to call back with any further concerns.

## 2015-06-20 NOTE — Telephone Encounter (Signed)
Thank you :)

## 2015-06-20 NOTE — Telephone Encounter (Signed)
Discussed  SUGGESTED A SECOND OPINION with Dr. Elwyn LadeStoner as soon as possible  Please arrange   Thank you

## 2015-06-20 NOTE — Telephone Encounter (Signed)
    Please see me next week, no new studies needed. Thanks (Routing comment)

## 2015-06-20 NOTE — Telephone Encounter (Signed)
Spoke with patient and confirmed 6/6 @11 :30 [facility] Done

## 2015-06-20 NOTE — Telephone Encounter (Signed)
-----   Message from [facility] Brasser sent at 06/20/2015  4:08 PM EDT -----  Contact: [phone]  Name of caller: XLKGMW,NUUVO [[id number]]Slotnick,Rashaud [[id number]]  Relationship to patient: self  Phone number: [phone]     Reason for call: patient calling vascular appt that was made for 6/5 patient cant make patient will be out of town called vascular and rescheduled for 6/13 Dr. Haze RushingStoners next day in the office patient also cant make that appt so I rescheduled with Vascular again for 6/20 Dr. Haze RushingStoners next day and patient cant take that appt due to the surgery being 6/26 and this appt for a 2nd opinion please advise

## 2015-06-20 NOTE — Telephone Encounter (Signed)
Please advise pt is unable to make multiple appt that they tried to schedule pt for his second opinion

## 2015-06-21 ENCOUNTER — Telehealth: Payer: Self-pay

## 2015-06-21 ENCOUNTER — Encounter (INDEPENDENT_AMBULATORY_CARE_PROVIDER_SITE_OTHER): Payer: Self-pay

## 2015-06-21 NOTE — Telephone Encounter (Signed)
-----   Message from Iva LentoBrooke Everts sent at 06/21/2015  9:20 AM EDT -----  Contact: [phone]  Name of caller:  Patient   Relationship to patient:  Phone number:  [phone]     Reason for call:  Patient scheduled for dobutamine stress echo per vascular but patient says he spoke with Dr. Smith Robertao yesterday and he said he did not think the patient needed the test. Please confirm.

## 2015-06-21 NOTE — Telephone Encounter (Signed)
lmom 

## 2015-06-21 NOTE — Telephone Encounter (Signed)
He has to see Dr. Elwyn LadeStoner as I have suggested.  No need for debridement and stress echo

## 2015-06-21 NOTE — Telephone Encounter (Signed)
ok 

## 2015-06-21 NOTE — Telephone Encounter (Signed)
S/w pt - gave him message from Dr Smith Robertao about seeing Dr Elwyn LadeStoner & no need for Dobutamine SE ( I believe that's what Dr Smith Robertao meant in statement below)   Pt has caused some issues for himself - he has Dobutamine SE set up for 6/7 ( although pt is refusing Dobutamine but will walk on treadmill for SE )  - doesn't want to cx testing as then DR Raman could do his surgery at end of June.  He does have an appt with Dr Elwyn LadeStoner on 6/20 but he would not be able to do surgery until later in July.  He is anxious to have surgery.  I'm not sure what to tell him to do so left all appts.as scheduled.  FYI

## 2015-06-21 NOTE — Telephone Encounter (Signed)
Please advise 

## 2015-06-21 NOTE — Addendum Note (Signed)
Addended by: Verdie ShireWIGHT-REGNA, Linley Moxley A on: 06/21/2015 04:51 PM     Modules accepted: Orders

## 2015-06-21 NOTE — Telephone Encounter (Signed)
Vascular just called us - they are changing the Dobutamine to a Stress echo for pre-op clearance.  FYI

## 2015-06-23 ENCOUNTER — Other Ambulatory Visit: Payer: Self-pay | Admitting: Pediatrics

## 2015-06-23 LAB — CBC AND DIFFERENTIAL
Baso # K/uL: 0 10*3/uL (ref 0.0–0.1)
Basophil %: 0.7 %^% (ref 0.1–1.2)
Eos # K/uL: 0.1 10*3/uL (ref 0.0–0.5)
Eosinophil %: 1.7 %^% (ref 0.8–7.0)
Hematocrit: 48.9 %^% (ref 40.1–51.0)
Hemoglobin: 16.9 g/dL^g/dL (ref 13.7–17.5)
Immature Granulocytes Absolute: 0.01 10*3/uL (ref 0.0–0.2)
Immature Granulocytes: 0.2 %^% (ref 0.0–2.0)
Lymph # K/uL: 1.9 10*3/uL (ref 1.3–3.6)
Lymphocyte %: 31.6 %^% (ref 21.8–53.1)
MCH: 32.2 pg^pg (ref 25.7–32.2)
MCHC: 34.6 g/dL^g/dL (ref 32.3–36.5)
MCV: 93.1 fL^fL — ABNORMAL HIGH (ref 79.0–92.2)
Mono # K/uL: 0.7 10*3/uL (ref 0.3–0.8)
Monocyte %: 10.9 %^% (ref 5.3–12.2)
Neut # K/uL: 3.3 10*3/uL (ref 1.8–5.4)
Nucl RBC # K/uL: 0 /100 WBC^/100 WBC (ref 0.0–0.2)
Platelets: 209 10*3/uL (ref 163–337)
RBC Distribution Width-SD: 45.2 fL^fL — ABNORMAL HIGH (ref 35.1–43.9)
RBC: 5.25 10*6/uL (ref 4.63–6.08)
RDW: 13.2 %^% (ref 11.6–14.4)
Seg Neut %: 54.9 %^% (ref 34.0–67.9)
WBC: 6 10*3/uL (ref 4.2–9.1)

## 2015-06-23 LAB — COMPREHENSIVE METABOLIC PANEL
ALT: 32 U/L^U/L (ref 0–50)
AST: 35 U/L^U/L (ref 0–50)
Albumin: 4.2 mg/dL^mg/dL (ref 3.5–5.2)
Alk Phos: 28 U/L^U/L — ABNORMAL LOW (ref 40–130)
Anion Gap: 11 (ref 7–16)
Bilirubin,Total: 1.8 mg/dL^mg/dL — ABNORMAL HIGH (ref 0.0–1.2)
CO2: 28 mmol/L^mmol/L (ref 20–28)
Calcium: 9.1 mg/dL^mg/dL (ref 8.6–10.2)
Chloride: 99 mmol/L^mmol/L (ref 96–108)
Creatinine: 1.34 mg/dL^mg/dL — ABNORMAL HIGH (ref 0.67–1.17)
GFR,Black: 66 mL/min/{1.73_m2}
GFR,Caucasian: 55 mL/min/{1.73_m2}
Glucose: 96 mg/dL^mg/dL (ref 60–99)
Lab: 15 mg/dL^mg/dL (ref 6–20)
Potassium: 4.2 mmol/L^mmol/L (ref 3.4–4.7)
Sodium: 138 mmol/L^mmol/L (ref 133–145)
Total Protein: 7.2 g/dL^g/dL (ref 6.3–7.7)

## 2015-06-23 LAB — TSH: TSH: 2.75 uIU/mL^uIU/mL (ref 0.27–4.20)

## 2015-06-23 LAB — LIPID PANEL
Chol/HDL Ratio: 2.4 (ref 1.0–3.5)
Cholesterol: 177 mg/dL^mg/dL (ref 0–199)
HDL: 75 mg/dL^mg/dL — ABNORMAL HIGH (ref 40–60)
LDL Calculated: 82 mg/dL^mg/dL (ref 0–129)
Triglycerides: 98 mg/dL^mg/dL (ref 0–149)

## 2015-06-27 ENCOUNTER — Ambulatory Visit: Payer: Self-pay | Admitting: Vascular Surgery

## 2015-06-27 ENCOUNTER — Telehealth: Payer: Self-pay

## 2015-06-27 NOTE — Telephone Encounter (Signed)
Returned call to patient, patient scheduled for CEA on the right side with Dr. Dionicia Ableraman at the end of the month, patient concerned he is "doing harm" by running his normal 2-3 miles 2-3 times weekly. After discussing with NP, advised patient okay to run as part of his normal daily/weekly routine. Patient verbalized understanding, in agreement with plan.

## 2015-06-27 NOTE — Telephone Encounter (Signed)
The patient is scheduled for a procedure at the end of June and wants to know if have any physical restrictions. He wants to do a light job  Few times a week.

## 2015-06-28 ENCOUNTER — Ambulatory Visit
Admission: RE | Admit: 2015-06-28 | Discharge: 2015-06-28 | Disposition: A | Discharge status: Home / Self Care | Payer: Self-pay | Source: Ambulatory Visit | Admitting: Cardiology

## 2015-06-28 ENCOUNTER — Ambulatory Visit
Admission: RE | Admit: 2015-06-28 | Discharge: 2015-06-28 | Disposition: A | Discharge status: Home / Self Care | Payer: Self-pay | Source: Ambulatory Visit | Attending: Cardiology | Admitting: Cardiology

## 2015-06-28 DIAGNOSIS — I6521 Occlusion and stenosis of right carotid artery: Secondary | ICD-10-CM

## 2015-06-28 MED ORDER — PERFLUTREN LIPID MICROSPHERE 1.1 MG/ML (DEFINITY) IV SUSP *I*
1.3000 mL | INTRAVENOUS | Status: DC | PRN
Start: 2015-06-28 — End: 2015-06-29

## 2015-06-29 LAB — EXERCISE STRESS ECHO COMPLETE
Aortic Diameter (mid tubular): 2.66 cm
BP Diastolic: 100 mmHg
BP Systolic: 160 mmHg
E/A ratio: 1.05
Estimated workload: 13.5 METS
Heart Rate: 73 {beats}/min
LA Systolic Diameter: 2.87 cm
LV ASE Mass: 165.1 gm
LV Posterior Wall Thickness: 1.01 cm
LV Septal Thickness: 1.09 cm
LVED Diameter: 4.52 cm
LVES Diameter: 2.71 cm
MPHR: 162 {beats}/min
MV Peak A Velocity: 69.6 cm/s
MV Peak E Velocity: 73.1 cm/s
Peak DBP: 80 mmHg
Peak HR: 157 {beats}/min
Peak SBP: 240 mmHg
Percent MPHR: 96.9 %
RPP: 37680 BPM x mmHG
RR Interval: 821.92 ms
Stress Peak Stage: 3.83
Stress duration (min): 11 min
Stress duration (sec): 30 s

## 2015-07-04 ENCOUNTER — Ambulatory Visit: Payer: Self-pay | Admitting: Vascular Surgery

## 2015-07-10 NOTE — Anesthesia Preprocedure Evaluation (Addendum)
Anesthesia Pre-operative History and Physical for Alejandro BargeDavid Chiles    The patient is a 60 year old man with asymptomatic right carotid artery stenosis that has worsened over the past year (now 80-90% stenosis).  His medical history is otherwise significant only for hyperlipidemia.  He is very active running 4-5 miles/day.    He presents for right carotid endarterectomy.  ______________________________________________________________________________________  History and Physical Completed at [facility]  Summary:  Alejandro BargeDavid Mende presents preoperatively for anesthesia evaluation prior to Carotid Endarterectomy Right w EEG Monitoring    (Right Neck) on 07/20/15.     He has a medical history significant for:  Stenosis of RCA (80-90% stenosed)  HLD  statin    The patient has no dyspnea, denies chest pain, is moderately active, climbs stairs and can lie flat.  Runs 4-5 miles/day.  Stress Test/Echocardiography:  06/28/15 exercise stress echo  The left ventricular ejection fraction is normal.  The echo response is negative for ischemia.  The patient's functional capacity is excellent.  The blood pressure response to exercise was hypertensive.  There is mild aortic regurgitation.  Electrophysiology/AICD/Pacer:  Ecg: sinus rhythm, rate: 61, borderline T abnormalities, inferior leads, T flat/neg II, III, AVF  By Jannetta QuintHERESE M COTTRELL, NP at 10:37 AM on 07/10/2015    <[email or web address]>  Anesthesia Evaluation Information Source: patient, records     ANESTHESIA  Pertinent(-):  history of anesthetic complications, Family Hx of Anesthetic Complications    GENERAL  Pertinent (-):  obesity, infection, history of anesthetic complications, Family Hx of Anesthetic Complications    HEENT    + Corrective Eyewear (readers)    + Neck Pain (does not limit his extension of his neck.)  Pertinent (-):   hearing loss, hoarseness, TMJD, sinus issues, nosebleeds PULMONARY    + Snoring  Pertinent(-): smoking, asthma, shortness of breath,  pneumonia, recent URI, cough/congestion, environmental allergies, sleep apnea, COPD    CARDIOVASCULAR  Excellent(10+METs) Exercise Tolerance    + Anticoagulants (plavix)            ASA, clopidrogel    + Vascular Issues (carotid stenosis)  Pertinent(-):  hypertension, past MI, cardiac testing, angina, CAD, CABG, coronary intervention, valvular heart disease, CHF, orthopnea, PND, DOE, DVT    GI/HEPATIC/RENAL  Last PO Intake: >8hr before procedure and >2hr before procedure (clears)    + Alcohol use            social  Pertinent(-):  GERD, hiatal hernia, PUD, liver  issues, bowel issues, renal issues, urinary issues NEURO/PSYCH    + Neuro deficit (left arm at times.)  Pertinent(-):  headaches, dizziness/motion sickness, syncope, seizures, cerebrovascular event, gait/mobility issues    ENDO/OTHER  Pertinent(-):  diabetes mellitus, thyroid disease    HEMALOGIC    + Anticoagulants (plavix)            ASA    + Blood dyscrasia            hyperlipidemia  Pertinent(-):  bruises/bleeds easily, arthritis       Physical Exam    Airway            Mouth opening: normal            Mallampati: II            TM distance (fb): >3 FB            Neck ROM: full  Dental        Cardiovascular  Rhythm: regular           Rate: normal  No peripheral edema or murmur    Neurologic    Normal Exam    General Survey    Normal Exam   Pulmonary   Normal Exam    breath sounds clear to auscultation    Mental Status   Normal Exam    oriented to person, place and time     Patient Education from [facility] Provider:  Patient Education from [facility]:  The following items were discussed with Alejandro Dennis to satisfaction and comprehension. All questions were addressed.  The facility's NPO guidelines were discussed  To call the surgeon if he becomes ill prior to surgery  All questions were answered  Medications DOS with sip of water: see AVS  May continue aspirin, plavix and ibuprofen  Nurse reviewed additional items as indicated in the education  record.  Chlohexidine scrub instructions reviewed.   ________________________________________________________________________  Plan  ASA Score  2  Anesthetic Plan general    Induction (routine IV); General Anesthesia/Sedation Maintenance Plan (inhaled agents and neuromuscular blockade);  Airway Manipulation (direct laryngoscopy); Airway (cuffed ETT); Line ( use current access, IV once asleep, additional large bore IV and A- line); Monitoring (standard ASA, EEG and postinduction arterial); Positioning (supine); PONV Plan (dexamethasone and ondansetron); Pain (per surgical team); PostOp (PACU)    Informed Consent     Risks:          Risks discussed were commensurate with the plan listed above with the following specific points: N/V, aspiration and sore throat , damage to:(eyes, teeth, nerves), allergic Rx, awareness, unexpected serious injury, death    Anesthetic Consent:      Anesthetic plan (and risks as noted above) were discussed with patient and spouse    Blood products Consent:        Use of blood products discussed with: patient and spouse and they consented    Plan also discussed with team members including:  resident, surgeon and attending    Attending Attestation:  As the primary attending anesthesiologist, I attest that the patient or proxy understands and accepts the risks and benefits of the anesthesia plan. I also attest that I have personally performed a pre-anesthetic examination and evaluation, and prescribed the anesthetic plan for this particular location within 48 hours prior to the anesthetic as documented. Philis Pique, MD 7:04 AM

## 2015-07-11 ENCOUNTER — Ambulatory Visit: Payer: Self-pay | Admitting: Vascular Surgery

## 2015-07-11 ENCOUNTER — Encounter: Payer: Self-pay | Admitting: Vascular Surgery

## 2015-07-11 VITALS — BP 122/60 | Ht 68.0 in | Wt 169.0 lb

## 2015-07-11 DIAGNOSIS — I779 Disorder of arteries and arterioles, unspecified: Secondary | ICD-10-CM

## 2015-07-11 MED ORDER — CLOPIDOGREL BISULFATE 75 MG PO TABS *I*
75.0000 mg | ORAL_TABLET | Freq: Every day | ORAL | 0 refills | Status: DC
Start: 2015-07-11 — End: 2015-08-07

## 2015-07-11 NOTE — Progress Notes (Signed)
Patient seen for second opinion at the request of Dr. Smith Robertao in Cardiology. In brief, known carotid artery disease, with recent progression to severe stenosis on the RIGHT side. Is asymptomatic, specifically denies visual defect, speech disturbance, motor or sensory loss. Followed for atherosclerotic disease, and underwent recent stress echocardiogram as a workup for carotid revascularization. Maintained on aspirin and statin. No chest pain or shortness of breath with exercise, carries out all ADLs. Works in the [employer] industry. Right-handed.    Allergies   Allergen Reactions    Ampicillin Rash    No Known Latex Allergy      Current Outpatient Prescriptions   Medication    tadalafil (CIALIS) 5 MG tablet    testosterone cypionate (DEPO-TESTOSTERONE) 200 MG/ML injection    aspirin 81 MG EC tablet    ibuprofen (ADVIL,MOTRIN) 200 MG tablet    atorvastatin (LIPITOR) 10 MG tablet    clopidogrel (PLAVIX) 75 MG tablet     No current facility-administered medications for this visit.      Past Medical History:   Diagnosis Date    Carotid artery stenosis, asymptomatic, right     HLD (hyperlipidemia)      Past Surgical History:   Procedure Laterality Date    INGUINAL HERNIA REPAIR Right      History   Smoking Status    Never Smoker   Smokeless Tobacco    Not on file     Visit Vitals    BP 122/60  Comment: right arm    Ht 1.727 m (5\' 8" )    Wt 76.7 kg (169 lb)    BMI 25.7 kg/m2   Normal carotid upstrokes, no bruit bilaterally  No heart murmur, normal rate and rhythm  Abdomen soft, non-tender, no bruit  2+ radial pulses bilaterally    Preop Modified Rankin Score:0 - No symptoms    Interpretation Summary   Relevant history: 7 mo f/u  Prior studies: 11/16    Right internal carotid artery (ICA)  Severe right internal carotid artery stenosis (80-99%). Endpoint visualized.  Right vertebral artery is antegrade.    Left internal carotid artery (ICA)  No significant left internal carotid artery stenosis (1-15%).    Left vertebral artery is antegrade.     Impression/Plan    High-grade carotid artery stenosis, asymptomatic. Good surgical risk, normal stress echocardiogram. Will start on Plavix for perioperative CV event risk reduction. Discussed the rationale, risks and benefits associated with open carotid revascularization, and he requests that we proceed. He has asked that I perform the operation, which I am happy to do, will discuss change with Dr. Dionicia Ableraman.

## 2015-07-11 NOTE — Invasive Procedure Plan of Care (Signed)
Invasive Procedure Plan of Care (Consent Form 419):   Condition(s) Addressed: Narrowing of the carotid artery in your neck, which is increasing chances of a future stroke   Person Performing Procedure: Mackayla Mullins C, and Other clinical faculty in the division of Vascular Surgery (Drs. Elwyn Lade, Dionicia Abler, Clarice Pole), Vascular Surgery residents   Side:    Procedure: Carotid endarterectomy on Right side   Special Equipment:    Planned Anesthesia: General   Benefits: Lower the future risk of stroke   Risks: Excessive bleeding, infection, damage to nearby tissues, damage to nerves which could result in voice changes or difficulty in eating/swallowing/speaking, stroke, heart attack, recurrence of narrowing of carotid artery in future, reactions to general anesthesia, need for future elective or emergent procedures, death   Alternatives: No surgery, continued non-surgical medical management   Expected Length of Stay: 1 day(s)   Pt Decision: Agrees to proceed     ----------------------------------------------------------------------------------------------------------------------------------------  Consent:  I hereby give my consent and authorize Macky Galik C, and Other clinical faculty in the division of Vascular Surgery (Drs. Elwyn Lade, Dionicia Abler, Clarice Pole), Vascular Surgery residents  (The list of possible assistants, all of whom are privileged to provide surgical services at the hospital, is available)  To treat the following: Narrowing of the carotid artery in your neck, which is increasing chances of a future stroke  Procedure includes: Carotid endarterectomy on Right side  1 The care provider has explained my condition to me, the benefits of having the above treatment procedure, and alternate ways of treating my condition. I understand that no guarantees have been made to me about the result of the treatment. The alternatives to this procedure include: No surgery, continued  non-surgical medical management   2 The care provider has discussed with me the reasonably foreseeable risks of the treatment and that there may be undesirable results. The risks that are specifically related to this procedure include: Excessive bleeding, infection, damage to nearby tissues, damage to nerves which could result in voice changes or difficulty in eating/swallowing/speaking, stroke, heart attack, recurrence of narrowing of carotid artery in future, reactions to general anesthesia, need for future elective or emergent procedures, death   3 I understand that during the treatment a condition may be discovered which was not known before the treatment started. Therefore, I authorize the care provider to perform any additional or different treatment which is thought necessary and available.   4 Any tissue, parts, or substances removed during the procedure may be retained or disposed of in accordance with customary scientific, educational and clinical practice.   5 Vendor information if appropriate:      6 Patient Consent for Medical or Surgical Procedure: I have carefully read and fully understand this informed consent form, and have had sufficient opportunity to discuss my condition and the above procedure(s) with the care provider and his/her associates, and all of my questions have been answered to my satisfaction.    Agrees to proceed       ____________________________________________________   _______________   Signature of Patient  Date/Time     ____________________________________________________   _______________   Signature of Parent or Legal Guardian  (if Patent is unable to sign or is a minor) Date/Time       Complete this section for all OR procedures and all other invasive internal procedures performed in any setting.  7 Consent for Receipt of Tissue(s): Yes (please list) Bovine pericardial patch to repair carotid artery during procedure  8 For those procedures that have the potential for  significant blood loss: transfusion is not expected to be needed but may be required and given in an emergency, I consent to the transfusion of blood or blood components that may be necessary before, during or after the procedure. I have been informed that no transfusion is 100% safe, however present testing methods make the risks of infection very small. Risks include infection from viruses, bacteria, or parasites, including but not limited to HIV (the AIDS virus) and hepatitis, as well as fever, chills, allergy, volume overload, or death. I have discussed possible alternatives with my care provider, including no transfusion, autologous transfusion (donation of my own blood), designated/directed donor transfusion (collection of blood from donors selected by me) or blood salvage during the procedure. I understand that these alternatives may not be available due to timing or health reasons, and the above risks may still apply.   9 Patient Consent for Blood/Tissue: I have had a chance to discuss the risks, benefits and alternatives regarding transfusion/receipt of tissue (as above) with my healthcare provider. My decision(s) regarding the transfusion of blood or blood components and/or the receipt of tissue are as above. I understand this covers my perioperative/periprocedural (before, during, and after the surgery/procedure) course of treatment.    Patient sign here unless 7 and 8 do not apply.       ____________________________________________________   _______________   Signature of Patient Date/Time     ____________________________________________________   _______________   Signature of Parent or Legal Guardian  (if Patent is unable to sign or is a minor) Date/Time      I have discussed the planned procedure, including the potential for any transfusion of blood products or receipt of tissue as necessary, expected benefits, the potential complications and risks and possible alternatives and their benefits and  risks with the patient or the patient's surrogate. In my opinion, the patent or the patient's surrogate understands the proposed procedure, its risks, benefits, and alternatives.    Electronically signed by Percell BostonMichael C Watson Robarge, MD at 11:06 AM

## 2015-07-12 ENCOUNTER — Encounter: Payer: Self-pay | Admitting: Nurse Practitioner

## 2015-07-12 ENCOUNTER — Ambulatory Visit
Admission: RE | Admit: 2015-07-12 | Discharge: 2015-07-12 | Disposition: A | Discharge status: Home / Self Care | Payer: Self-pay | Source: Ambulatory Visit | Attending: Vascular Surgery | Admitting: Vascular Surgery

## 2015-07-12 ENCOUNTER — Ambulatory Visit
Admission: RE | Admit: 2015-07-12 | Discharge: 2015-07-12 | Disposition: A | Discharge status: Home / Self Care | Payer: Self-pay | Source: Ambulatory Visit | Admitting: Vascular Surgery

## 2015-07-12 ENCOUNTER — Other Ambulatory Visit: Payer: Self-pay | Admitting: Gastroenterology

## 2015-07-12 LAB — TYPE AND SCREEN
ABO RH Blood Type: A POS
Antibody Screen: NEGATIVE

## 2015-07-12 LAB — URINALYSIS REFLEX TO CULTURE
Blood,UA: NEGATIVE
Ketones, UA: NEGATIVE
Leuk Esterase,UA: NEGATIVE
Nitrite,UA: NEGATIVE
Protein,UA: NEGATIVE mg/dL
Specific Gravity,UA: 1.013 (ref 1.002–1.030)
pH,UA: 7 (ref 5.0–8.0)

## 2015-07-12 LAB — MRSA (ORSA) AMPLIFICATION: MRSA (ORSA) Amplification: 0

## 2015-07-12 LAB — MCHC: MCHC: 34 g/dL (ref 32–37)

## 2015-07-12 LAB — LDL CHOLESTEROL, DIRECT: LDL Direct: 117 mg/dL

## 2015-07-12 LAB — HEMATOCRIT: Hematocrit: 49 % (ref 40–51)

## 2015-07-12 LAB — PROTIME-INR
INR: 0.9 (ref 0.9–1.1)
Protime: 10.3 s (ref 10.0–12.9)

## 2015-07-12 LAB — HEMOGLOBIN A1C: Hemoglobin A1C: 5 % (ref 4.0–6.0)

## 2015-07-12 NOTE — Discharge Instructions (Signed)
[facility] Preoperative Instructions            Patient Name: Alejandro Dennis  Surgery Date:  Wednesday, June 28        When to Arrive for Surgery         On the day prior to your surgery, Tuesday, June 27, you will find out your arrival time. [facility] - Please call [phone] between 2:30 and 7:00 p.m. .        Note: Patients scheduled for a procedure on Monday will be assigned an arrival time on the Friday before. Please note surgery start time is approximate. You may want to bring something to help pass the time.        PLEASE ARRIVE ON TIME.        Directions to Surgical Center        [facility]: On the day of your procedure, park in the parking garage and take the elevator/stairs to Level One (1), then follow the walkway to the Main Lobby.  Walk past the Information Desk in the lobby, towards the Lab & Outpatient Services.  Follow the GREEN (R) ceiling tags to the GREEN elevators. (Valet parking is available outside the front entrance of the hospital between 6:00 AM and 5:00 PM and assistance is available at the information desk, if needed).   Continue to the [facility]): Take the GREEN (G) elevators to the Basement (Level B - Two floors down) to the [facility] and check in with the receptionist at the desk.        Eating Guidelines        Follow the instructions below unless otherwise instructed by your physician.        No solid food AFTER MIDNIGHT on the day of your surgery. No candy, gum, mints or chewing tobacco.        You can have clear liquids up to 4 hours before your surgery. This includes water, apple juice,  clear carbonated beverages,  black coffee,  clear tea. No milk, cream, or non-dairy creamers.         Failure to follow these instructions, could lead to a delay or cancellation of your procedure.        Medication Guidelines        On the morning of surgery, take only the medications indicated on the  Preoperative Medication List below.        Medications should only be taken with no more than one ounce of water.                Additional Information           Surgery date: Wednesday  June 28     [facility]  Preoperative Vascular Surgery Patient Instructions  PREOPERATIVE SCRUBS     Welcome to [facility],    You are anticipating having surgery with [place], and there are a few important things that you can do to help [place] care for you. Infection is a potential risk with any surgery and we have identified some ways that you can help [place] lessen your risk:    YOUR SKIN - Germs normally live on your skin, and pose no risk to you, however when you have surgery, an incision is made into your skin which interrupts the normal protection that skin provides. In order to decrease the germs on your skin, we have you take two showers or baths, one  the night before your surgery and one the morning of the surgery. Shower or bathe first using your regular shampoo and soap/body wash. Rinse your body prior to use of the sponge with special germ killing soap (Chlorhexidine soap). If you cannot take a shower, then you should wash the area of your surgery with the special soap or wipe that we will give you. Gently wash the area for 30 seconds each from __BEHIND RIGHT EAR__   to __RIGHT NIPPLE LINE__.    Use Caution: Do not get the chlorhexadine in your eyes.   It has been reported that the chlorhexadine can make the shower slippery.    Rinse after washing the areas and dry with a freshly laundered towel and put on freshly laundered clothing.  Do not apply any lotions, moisturizers, deodorant, perfume, cologne or makeup after using the sponge.    NO SHAVING! Shaving your skin with a razor blade can actually increase your risk of infection. Do not shave any area of your body 2 days prior to the surgery except for your face. We may prepare areas of your skin by removing hair with a special clipper.    QUESTIONS? PLEASE  CALL [phone] and ask to speak with a Press photographer.      What to bring or wear:        Bring Photo ID and insurance information.        Eye glasses and/or hearing aids:  These may be removed prior to surgery so be prepared to leave them with a trusted family member.        Do NOT wear contact lenses.        Wear comfortable, loose fit clothing.          You must arrange a ride home before coming to surgery.        What NOT to bring or wear:        Before coming to the hospital, remove all makeup (including mascara), jewelry (including wedding band and watch), hair accessories and nail polish from toes and fingers.  Do not bring any valuables (money, wallet, purse, jewelry, or contact lenses.)        Information for After Surgery:        Your family will be directed to a waiting area when you are taken to surgery.        We ask that only one or two family members accompany you on the day of your procedure.        No children under the age of 8 are allowed as visitors in the [facility].        Additional visitor restrictions are possible during influenza season.        Your family will be notified when your surgery is completed and you have arrived on the patient care unit.        Expected Length of Stay:        SDA Admission:  You are being admitted to the hospital after surgery. Please leave any luggage in your car until after your procedure.  Your family can bring this into the hospital once you are in your room.        Health standards require that a responsible adult must accompany any patient who has received anesthetics or sedation and is going home the same day. You must arrange a ride home before coming to surgery.        Questions:    Please  call the Center [facility] at [phone] between 8:00 a.m. and 4:00 p.m. Monday through Friday. You were seen today by Karen Kitchensherese  Toria Monte FNP                Surgical Site Infections FAQs        What is a Surgical Site infection  (SSI)?  A surgical site infection is an infection that occurs after surgery In the part of the body where the surgery took place. Most patients who have surgery do not develop an infection. However, Infections develop in about 1 to 3 out of every 100 patients who have surgery.         Some of the common symptoms of a surgical site infection are:    Redness and pain around the area where you had surgery    Drainage of cloudy fluid from your surgical wound    Fever         Can SSIs be treated?   Yes. Most surgical site infections can be treated with antibiotics. The antibiotic given to you depends on the bacteria (germs) causing the Infection. Sometimes patients with SSIs also need another surgery to treat the infection.         What are some of the things that hospitals are doing to prevent SSls?   To prevent SSIs, doctors, nurses, and other healthcare providers:    Clean their hands and arms up to their elbows with an antiseptic agent just before the surgery.    Clean their hands with soap and water or an alcohol-based hand rub before and after caring for each patient.    May remove some of your hair Immediately before your surgery using electric clippers If the hair Is in the same area where the procedure will occur. They should not shave you with a razor.    Wear special hair covers, masks, gowns, and gloves during surgery to keep the surgery area clean.    Give you antibiotics before your surgery starts. in most cases, you should get antibiotics within 60 mInutes before the surgery starts and the antibiotics should be stopped within 24 hours after surgery.    Clean the skin at the site of your surgery with a special soap that kills germs.         What can I do to help prevent SSIs?   Before your surgery:    Tell your doctor about other medical problems you may have. Health problems such as allergies, diabetes, and obesity could affect your surgery and your treatment.    Quit smoking. Patients who smoke  get more Infections. Talk to your doctor about how you can quit before your surgery.    Do not shave near where you will have surgery. Shaving with a razor can Irritate your skin and make it easier to develop an infection.         At the time of your surgery:    Speak up if someone tries to shave you with a razor before surgery. Ask why you need to be shaved and talk with your surgeon if you have any concerns.    Ask if you will get antibiotics before surgery.         After your surgery:    Make sure that your healthcare providers clean their hands before examining you, either with soap and water or an alcohol-based hand rub.   If you do not see your healthcare providers wash their hands,  please ask them to do so.     Family and friends who visit you should not touch the surgical wound or dressings.    Family and friends should clean their hands with soap and water or an alcohol-based hand rub before and after visiting you. If you do not see them clean their hands, ask them to clean their hands.   What do I need to do when I go home from the hospital?    Before you go home, your doctor or nurse should explain everyt hing you need to know about taking care of your wound. Make sure you understand how to care for your wound before you leave the hospital.    Always clean your hands before and after caring for your wound.    Before you go home, make sure you know who to contact If you have questions or problems after you get home.    If you have any symptoms of an Infection, such as redness and pain at the surgery site, drainage, or fever, call your doctor immediately.   if you have additional questions, Please ask your doctor or nurse.

## 2015-07-13 LAB — EKG 12-LEAD
P: 48 degrees
QRS: 85 degrees
Rate: 61 {beats}/min
Severity: BORDERLINE
Severity: BORDERLINE
Statement: BORDERLINE
T: -8 degrees

## 2015-07-19 ENCOUNTER — Encounter
Admission: RE | Disposition: A | Discharge status: Home / Self Care | Payer: Self-pay | Source: Ambulatory Visit | Attending: Vascular Surgery

## 2015-07-19 ENCOUNTER — Inpatient Hospital Stay
Admission: RE | Admit: 2015-07-19 | Disposition: A | Discharge status: Home / Self Care | Payer: Self-pay | Source: Ambulatory Visit | Attending: Vascular Surgery | Admitting: Vascular Surgery

## 2015-07-19 ENCOUNTER — Encounter: Payer: Self-pay | Admitting: Neurosurgery

## 2015-07-19 ENCOUNTER — Encounter: Payer: Self-pay | Admitting: Nurse Practitioner

## 2015-07-19 HISTORY — PX: OTHER SURGICAL HISTORY: SHX169

## 2015-07-19 LAB — RED BLOOD CELLS
Coded Blood type: 6200
Coded Blood type: 6200
Component blood type: A POS
Component blood type: A POS

## 2015-07-19 LAB — POCT GLUCOSE: Glucose POCT: 98 mg/dL (ref 60–99)

## 2015-07-19 SURGERY — ENDARTERECTOMY, CAROTID
Anesthesia: General | Site: Neck | Laterality: Right | Wound class: Clean

## 2015-07-19 MED ORDER — MIDAZOLAM HCL 1 MG/ML IJ SOLN *I* WRAPPED
INTRAMUSCULAR | Status: AC
Start: 2015-07-19 — End: 2015-07-19
  Filled 2015-07-19: qty 2

## 2015-07-19 MED ORDER — LIDOCAINE HCL 2 % IJ SOLN *I*
INTRAMUSCULAR | Status: DC | PRN
Start: 2015-07-19 — End: 2015-07-19
  Administered 2015-07-19: 80 mg via INTRAVENOUS

## 2015-07-19 MED ORDER — VANCOMYCIN HCL IN DEXTROSE 5 MG/ML IV SOLN *I*
1000.0000 mg | Freq: Once | INTRAVENOUS | Status: DC
Start: 2015-07-19 — End: 2015-07-19

## 2015-07-19 MED ORDER — LACTATED RINGERS IV SOLN *I*
20.0000 mL/h | INTRAVENOUS | Status: DC
Start: 2015-07-19 — End: 2015-07-19

## 2015-07-19 MED ORDER — HYDROMORPHONE HCL PF 1 MG/ML IJ SOLN *WRAPPED*
0.5000 mg | INTRAMUSCULAR | Status: DC | PRN
Start: 2015-07-19 — End: 2015-07-20

## 2015-07-19 MED ORDER — FENTANYL CITRATE 50 MCG/ML IJ SOLN *WRAPPED*
INTRAMUSCULAR | Status: AC
Start: 2015-07-19 — End: 2015-07-19
  Filled 2015-07-19: qty 2

## 2015-07-19 MED ORDER — HYDROMORPHONE HCL 2 MG/ML IJ SOLN *WRAPPED*
0.4000 mg | INTRAMUSCULAR | Status: DC | PRN
Start: 2015-07-19 — End: 2015-07-19

## 2015-07-19 MED ORDER — PLASMA-LYTE IV SOLN *WRAPPED*
Status: DC | PRN
Start: 2015-07-19 — End: 2015-07-19

## 2015-07-19 MED ORDER — OXYCODONE HCL 5 MG PO TABS *I*
5.0000 mg | ORAL_TABLET | ORAL | Status: DC | PRN
Start: 2015-07-19 — End: 2015-07-20

## 2015-07-19 MED ORDER — HYDRALAZINE HCL 20 MG/ML IJ SOLN *I*
10.0000 mg | INTRAMUSCULAR | Status: DC | PRN
Start: 2015-07-19 — End: 2015-07-20

## 2015-07-19 MED ORDER — LIDOCAINE HCL 1 % IJ SOLN *I*
0.1000 mL | INTRAMUSCULAR | Status: DC | PRN
Start: 2015-07-19 — End: 2015-07-19

## 2015-07-19 MED ORDER — FENTANYL CITRATE 50 MCG/ML IJ SOLN *WRAPPED*
INTRAMUSCULAR | Status: AC
Start: 2015-07-19 — End: 2015-07-19
  Filled 2015-07-19: qty 5

## 2015-07-19 MED ORDER — ONDANSETRON HCL 2 MG/ML IV SOLN *I*
4.0000 mg | Freq: Four times a day (QID) | INTRAMUSCULAR | Status: DC | PRN
Start: 2015-07-19 — End: 2015-07-20

## 2015-07-19 MED ORDER — SODIUM CHLORIDE 0.9 % IV SOLN WRAPPED *I*
20.0000 mL/h | Status: DC
Start: 2015-07-19 — End: 2015-07-19

## 2015-07-19 MED ORDER — HEPARIN SODIUM 5000 UNIT/ML SQ *I*
5000.0000 [IU] | Freq: Three times a day (TID) | SUBCUTANEOUS | Status: DC
Start: 2015-07-19 — End: 2015-07-20
  Administered 2015-07-19 – 2015-07-20 (×2): 5000 [IU] via SUBCUTANEOUS
  Filled 2015-07-19 (×2): qty 1

## 2015-07-19 MED ORDER — ACETAMINOPHEN 500 MG PO TABS *I*
1000.0000 mg | ORAL_TABLET | Freq: Three times a day (TID) | ORAL | Status: DC
Start: 2015-07-19 — End: 2015-07-20
  Administered 2015-07-19 – 2015-07-20 (×2): 1000 mg via NASOGASTRIC
  Filled 2015-07-19 (×2): qty 2

## 2015-07-19 MED ORDER — SUGAMMADEX SODIUM 100 MG/1ML IV SOLN *WRAPPED*
INTRAVENOUS | Status: AC
Start: 2015-07-19 — End: 2015-07-19
  Filled 2015-07-19: qty 2

## 2015-07-19 MED ORDER — BUPIVACAINE-EPINEPHRINE 0.5 % IJ SOLUTION *WRAPPED*
INTRAMUSCULAR | Status: DC | PRN
Start: 2015-07-19 — End: 2015-07-19
  Administered 2015-07-19: 10 mL via SUBCUTANEOUS

## 2015-07-19 MED ORDER — SODIUM CHLORIDE 0.9 % IV SOLN WRAPPED *I*
50.0000 mL/h | Status: DC
Start: 2015-07-19 — End: 2015-07-20
  Administered 2015-07-19: 50 mL/h via INTRAVENOUS

## 2015-07-19 MED ORDER — PROPOFOL 10 MG/ML IV EMUL (INTERMITTENT DOSING) WRAPPED *I*
INTRAVENOUS | Status: DC | PRN
Start: 2015-07-19 — End: 2015-07-19
  Administered 2015-07-19: 150 mg via INTRAVENOUS

## 2015-07-19 MED ORDER — LIDOCAINE HCL 1 % IJ SOLN *I*
INTRAMUSCULAR | Status: DC | PRN
Start: 2015-07-19 — End: 2015-07-19
  Administered 2015-07-19: .3 mL

## 2015-07-19 MED ORDER — CLOPIDOGREL BISULFATE 75 MG PO TABS *I*
75.0000 mg | ORAL_TABLET | Freq: Every day | ORAL | Status: DC
Start: 2015-07-20 — End: 2015-07-20
  Administered 2015-07-20: 75 mg via ORAL
  Filled 2015-07-19 (×2): qty 1

## 2015-07-19 MED ORDER — PROMETHAZINE HCL 25 MG/ML IJ SOLN *I*
6.2500 mg | Freq: Once | INTRAMUSCULAR | Status: DC | PRN
Start: 2015-07-19 — End: 2015-07-19

## 2015-07-19 MED ORDER — SODIUM CHLORIDE 0.9 % IV SOLN WRAPPED *I*
INTRAVENOUS | Status: DC | PRN
Start: 2015-07-19 — End: 2015-07-19
  Administered 2015-07-19: 500 mL

## 2015-07-19 MED ORDER — VANCOMYCIN HCL 1000 MG IV SOLR WRAPPED *I*
INTRAVENOUS | Status: DC | PRN
  Administered 2015-07-19: 1000 mg via INTRAVENOUS

## 2015-07-19 MED ORDER — ATORVASTATIN CALCIUM 10 MG PO TABS *I*
10.0000 mg | ORAL_TABLET | ORAL | Status: DC
Start: 2015-07-19 — End: 2015-07-20
  Filled 2015-07-19: qty 1

## 2015-07-19 MED ORDER — HALOPERIDOL LACTATE 5 MG/ML IJ SOLN *I*
0.5000 mg | Freq: Once | INTRAMUSCULAR | Status: DC | PRN
Start: 2015-07-19 — End: 2015-07-19

## 2015-07-19 MED ORDER — ROCURONIUM BROMIDE 10 MG/ML IV SOLN *WRAPPED*
Status: DC | PRN
Start: 2015-07-19 — End: 2015-07-19
  Administered 2015-07-19: 10 mg via INTRAVENOUS
  Administered 2015-07-19: 70 mg via INTRAVENOUS

## 2015-07-19 MED ORDER — MIDAZOLAM HCL 1 MG/ML IJ SOLN *I* WRAPPED
INTRAMUSCULAR | Status: DC | PRN
Start: 2015-07-19 — End: 2015-07-19
  Administered 2015-07-19: 2 mg via INTRAVENOUS

## 2015-07-19 MED ORDER — ASPIRIN 81 MG PO TBEC *I*
81.0000 mg | DELAYED_RELEASE_TABLET | Freq: Every morning | ORAL | Status: DC
Start: 2015-07-19 — End: 2015-07-20
  Administered 2015-07-20: 81 mg via ORAL
  Filled 2015-07-19: qty 1

## 2015-07-19 MED ORDER — NEOMYCIN SULFATE IRRIGATION 0.2% *I*
Status: DC | PRN
Start: 2015-07-19 — End: 2015-07-19
  Administered 2015-07-19: 1000 mL

## 2015-07-19 MED ORDER — HEPARIN SODIUM (PORCINE) 1000 UNIT/ML IJ SOLN *WRAPPED*
Status: DC | PRN
  Administered 2015-07-19: 5000 [IU] via INTRAVENOUS

## 2015-07-19 MED ORDER — FENTANYL CITRATE 50 MCG/ML IJ SOLN *WRAPPED*
INTRAMUSCULAR | Status: DC | PRN
Start: 2015-07-19 — End: 2015-07-19
  Administered 2015-07-19 (×3): 50 ug via INTRAVENOUS
  Administered 2015-07-19 (×2): 100 ug via INTRAVENOUS

## 2015-07-19 MED ORDER — PROTAMINE SULFATE 10 MG/ML IV SOLN *I*
INTRAVENOUS | Status: DC | PRN
Start: 2015-07-19 — End: 2015-07-19
  Administered 2015-07-19: 1 mg via INTRAVENOUS
  Administered 2015-07-19: 49 mg via INTRAVENOUS

## 2015-07-19 MED ORDER — DEXAMETHASONE SODIUM PHOSPHATE 4 MG/ML INJ SOLN *WRAPPED*
INTRAMUSCULAR | Status: DC | PRN
Start: 2015-07-19 — End: 2015-07-19
  Administered 2015-07-19: 4 mg via INTRAVENOUS

## 2015-07-19 SURGICAL SUPPLY — 45 items
ADHESIVE SKIN CLOSURE 0.7ML DERMABOND ADVANCED (Dressing) ×2 IMPLANT
APPLICATOR CHLORAPREP 26ML ORANGE LARGE (Solution) ×2 IMPLANT
CANNULA VES CLR BODY 3MM BVL TIP DLP 2IN OVERALL LEN (Supply) ×1 IMPLANT
CANNULA VESSEL LF (Supply) ×1
CLIP HEMO TITANIUM MED (Supply) ×2 IMPLANT
CLIP HEMO TITANIUM YELLOW SM (Supply) ×2 IMPLANT
CLIP INT SM WIDE RED TI TRNSVRS GRV CHEVRON SHP W/ PRECIS TIP TO TIP CLSR WECK HORZ (Supply) ×1 IMPLANT
CLIP LIGATING TI SM (Supply) ×1
COVER DISPOSABLE CAMERA FOR PRECISION HD (Supply) ×2 IMPLANT
COVER LIGHT HANDLE BLUE LF (Supply) ×2 IMPLANT
DRAIN PENROSE 18 X .25IN STER LTX (Supply) ×2 IMPLANT
DRAPE FLUID WARMER 44 X 44IN LF (Drape) IMPLANT
DRAPE SHEET 70X100 (Drape) ×1
DRAPE SUR W70XL100IN STD SMS POLYPR FULL SHT W/O FLD PCH DISP (Drape) ×1 IMPLANT
FILTER NEPTUNE 4PORT MANIFOLD (Supply) ×2 IMPLANT
GLOVE BIOGEL PI PRO-FIT SZ 7.0 LF (Glove) ×4 IMPLANT
GLOVE SURG BIOGEL SZ7.5 LTX STER PF (Glove) ×2 IMPLANT
GRAFT VASC W0.3XL3IN THK0.76MM KNIT DBL VEL FAB TAPR END HEMSHLD (Implant) ×2 IMPLANT
KIT MATRIX HEMOSTATIC SURGIFLO 8ML (Supply) IMPLANT
KIT SHUNT ARGYLE CAROTID 14FR 6IN (Cardiac Catheter (Interventional)) IMPLANT
MARKER SKIN WRITESITE PLUS (Supply) IMPLANT
NEEDLE HYPO BVL LF 25G X 1.5IN (Needle) ×2 IMPLANT
NEEDLE HYPO LF 30G X .5IN BEIGE (Needle) ×2 IMPLANT
PACK CUSTOM CAROTID CDS (Pack) ×2 IMPLANT
PACK TOWEL ORTHO LIGHT BLUE STER (Supply) ×2 IMPLANT
PAD EYE GAUZE COVERED OVAL LF STER (Dressing) ×4 IMPLANT
SHIELD EYE ~~LOC~~ 3 X 2.5IN CLEAR (Dressing) ×4 IMPLANT
SOL SOD CHL IRRIG 1500ML BTL (Solution) ×2 IMPLANT
SOL SOD CHL IRRIG 500ML BTL (Solution) ×2 IMPLANT
SPONGE SURGICEL ABS 4 X 8IN (Sponge) ×2 IMPLANT
SPONGE SURGIFOAM GELATIN 100 (Sponge) IMPLANT
SPONGE X-RAY 4 X 8 STERILE NL (Sponge) ×2 IMPLANT
SUTR MONCRYL 4-0 PS-2 UND 27IN (Suture) ×2 IMPLANT
SUTR PROLENE 6-0 BV-1 24IN BLUE (Suture) ×8 IMPLANT
SUTR PROLENE 6-0 C-1 24IN BLUE (Suture) ×6 IMPLANT
SUTR PROLENE MONO 5-0 C-1 24IN (Suture) IMPLANT
SUTR PROLENE MONO 5-0 C-1 BLUE (Suture) ×2 IMPLANT
SUTR VICRYL ANTIB 2-0 CT-1 27 VIOLET (Suture) ×2 IMPLANT
SUTR VICRYL ANTIB 3-0 SH 18 UNDY (Suture) ×2 IMPLANT
SUTR VICRYL ANTIB 3-0 SH 27 UNDY (Suture) ×2 IMPLANT
SYRINGE IRRIG BULB 50CC (Supply) ×2 IMPLANT
SYRINGE LUERLOCK 1CC LF (Supply) ×2 IMPLANT
SYRINGE LUERLOCK 30ML INDIVIDUAL WRAP (Supply) IMPLANT
TIP YANKAUER SUCT BULBOUS ON/OFF LF (Supply) ×2 IMPLANT
TRAY FOLEY SIL PREC400 PREM U/M 14FR (Other) IMPLANT

## 2015-07-19 NOTE — INTERIM OP NOTE (Signed)
Interim Op Note (Surgical Log ID: [id number])       Date of Surgery: 07/19/2015       Surgeons: Moishe SpiceSurgeon(s) and Role:     * Percell BostonStoner, Michael C, MD - Primary     * Silvestre Gunnerasheed, Alzena Gerber, MD - Resident - Assisting       Pre-op Diagnosis: Pre-Op Diagnosis Codes:     * Stenosis of right carotid artery [I65.21]       Post-op Diagnosis: Post-Op Diagnosis Codes:     * Stenosis of right carotid artery [I65.21]       Procedure(s) Performed: Procedures:    * Carotid Endarterectomy           Additional CPT Codes: 0454035301: Pr Thromboendartectmy Neck,Neck Incis;         Anesthesia Type: General        Fluid Totals: I/O this shift:  06/28 0700 - 06/28 1459  In: 2000 (26.3 mL/kg) [I.V.:2000]  Out: 30 (0.4 mL/kg) [Blood:30]  Net: 1970  Weight: 76.1 kg        Estimated Blood Loss: Blood Loss: 30 mL       Specimens to Pathology:  * No specimens in log *       Temporary Implants:        Packing:                 Patient Condition: good       Findings (Including unexpected complications): Right CEA     Signed:  Silvestre GunnerKhurram Adisyn Ruscitti, MD  on 07/19/2015 at 9:54 AM

## 2015-07-19 NOTE — Anesthesia Procedure Notes (Signed)
---------------------------------------------------------------------------------------------------------------------------------------    AIRWAY   GENERAL INFORMATION AND STAFF    Patient location during procedure: OR       Date of Procedure: 07/19/2015 8:01 AM  CONDITION PRIOR TO MANIPULATION     Current Airway/Neck Condition:  Normal        For more airway physical exam details, see Anesthesia PreOp Evaluation  AIRWAY METHOD     Patient Position:  Sniffing    Preoxygenated: yes      Induction: IV    Mask Difficulty Assessment:  2 - vent by mask + OPA/NPA       Mask NMB: 2 - vent by mask + OPA/NPA      Technique Used for Successful ETT Placement:  Direct laryngoscopy    Blade Type:  Macintosh    Laryngoscope Blade/Video laryngoscope Blade Size:  3    Cormack-Lehane Classification:  Grade IIb - view of arytenoids or posterior of glottis only    Placement Verified by: capnometry, auscultation, palpation of cuff and equal breath sounds      Number of Attempts at Approach:  1    Number of Other Approaches Attempted:  0  FINAL AIRWAY DETAILS    Final Airway Type:  Endotracheal airway    Final Endotracheal Airway:  ETT      Cuffed: cuffed    Insertion Site:  Oral    ETT Size (mm):  8.0    Cuff Volume (mL):  4    Distance inserted from Lips (cm):  23  ----------------------------------------------------------------------------------------------------------------------------------------

## 2015-07-19 NOTE — Anesthesia Postprocedure Evaluation (Signed)
Anesthesia Post-Op Note    Patient: Alejandro Dennis    Procedure(s) Performed:  Procedure Summary     Date Anesthesia Start Anesthesia Stop Room / Location    07/19/15 0728 [id number] [facility]       Procedure Diagnosis Surgeon Attending Anesthesia    Carotid Endarterectomy     (Right Neck) Stenosis of right carotid artery  (Stenosis of right carotid artery [I65.21]) Percell BostonStoner, Michael C, MD Philis Piqueooley, Ulric Salzman W, MD        Recovery Vitals  BP: 127/83 (07/19/2015 10:00 AM)  Heart Rate: 79 (07/19/2015 10:00 AM)  Heart Rate (via Pulse Ox): 78 (07/19/2015 10:00 AM)  Resp: 16 (07/19/2015 10:00 AM)  Temp: 36 C (96.8 F) (07/19/2015  9:57 AM)  SpO2: 100 % (07/19/2015 10:00 AM)  O2 Device: Nasal cannula (07/19/2015 10:00 AM)  O2 Flow Rate: 2 L/min (07/19/2015 10:00 AM)   0-10 Scale: 0 (07/19/2015 10:00 AM)  Anesthesia type:  General  Complications Noted During Procedure or in PACU:  None   Comment:    Patient Location:  PACU  Level of Consciousness:    Awake, alert and oriented  Patient Participation:     Able to participate  Temperature Status:    Normothermic  Oxygen Saturation:    Within patient's normal range  Cardiac Status:   Within patient's normal range  Fluid Status:    Stable and euvolemic  Airway Patency:     Yes  Pulmonary Status:    Baseline  Pain Management:    Adequate analgesia  Nausea and Vomiting:  None  Comments:    He notes a mild sore throat.  He overall feels well.  Post Op Assessment:    Tolerated procedure well and no evidence of recall   Attending Attestation:  All indicated post anesthesia care provided     -

## 2015-07-19 NOTE — Progress Notes (Signed)
Report given to  Unknown FoleyKatherine Hester RN on [phone].  Reviewed PMH, OR Case, LDA's and recovery course. All questions answered and receiving RN indicated understanding. All VSS upon transport. Verbal order to change neuro checks to Q2 hours. Provider will write order when able ( currently in OR). Updated 736 RN, indicated understanding.  Tranported via stretcher on room air without issue. Wife at the bedside.       Ferman Hammingarlee N Aleeta Schmaltz, RN

## 2015-07-19 NOTE — Progress Notes (Signed)
Vascular Surgery Post Op Note    Subjective:   POD#0  R CEA  NAE  Denies HA, SOB, CP, N/V, fevers/chills  Tolerating diet / voiding   Objective:      BP: (100-134)/(69-94)   Temp:  [36 C (96.8 F)-36.4 C (97.5 F)]   Temp src: Temporal (06/28 1300)  Heart Rate:  [70-91]   Resp:  [13-19]   SpO2:  [96 %-100 %]   Height:  [172.7 cm (5\' 8" )]   Weight:  [76.1 kg (167 lb 12.3 oz)]     Intake/Output       CMP  No results for input(s): NA, K, CL, CO2, CREAT, ALT, AST, APHOS, TB, DB, AMY, LIP in the last 168 hours.    No components found with this basename: BUN, LABGLOM, GLUCOSE, CALCIUM,  LAC    Coags  No results for input(s): INR, PTT in the last 168 hours.    No components found with this basename: APTT    CBC  No results for input(s): WBC, HGB, HCT, PLT in the last 168 hours.    Physical Exam:      General appearance: alert, no distress and cooperative  HEENT: PERRLA, extra ocular movement intact, sclera clear, anicteric and neck supple with midline trachea, R neck with no hematoma, drainage or erythema, slight edema/ecchymosis   CV: RRR, S1, S2 normal, no murmur, click, rub or gallop  Lungs:clear  Abd: soft, non-tender. Bowel sounds normal. No masses,  no organomegaly    Extremities:  R femoral pulse +2  R DP +2  R PT +2  Cap refill <3 secs  Motor/sensory intact    L femoral pulse +2  L DP +2  L PT +2  Cap refill <3secs  Motor/sensory intact   Neuro: normal without focal findings, mental status, speech normal, alert and oriented x3, PERLA and reflexes normal and symmetric     Assessment:   Alejandro Dennis is a 60 y.o. male w/ right carotid stenosis - s/p 07/18/15 R CEA       Plan:     E/F:  Replete PRN   Diet: regular   Cardiac: ASA, Plavix, atorvastatin, hydralazine PRN   Pulmonary: I/S, wean oxygen   GI: zofran, lactobacillus   Endocrine: n/a  Uro: voiding   Abx: pre-op   Anti-platelet: ASA/Plavix  Pain control: tylenol. Dilaudid   Dressing/incision/wound plan: R neck - Dermabond - open to air   DVT prophylaxis:  Heparin sq     Overall plan for the day: OOB  Diet, SBP control < 140, dispo 96/29 to home with no changes       Diagnoses for this hospitalization include:  Carotid stenosis s/p 07/19/15 R CEA     Please page the vascular surgery resident on call for any questions.  Erin SonsESIREE M Sharmaine Bain, NP   Vascular Surgery   07/19/2015 at 3:20 PM

## 2015-07-19 NOTE — Progress Notes (Signed)
Vascular Post-op Check Note    Subjective:   Feels well, pain is well controlled. Denies any n/v, sob/cp, paresthesias, weakness paralysis. Bedside swallow test went fine, tolerating clears.      Objective:     Vitals:  Visit Vitals    BP 119/76    Pulse 91    Temp 36 C (96.8 F) (Temporal)    Resp 17    Ht 1.727 m (5\' 8" )    Wt 76.1 kg (167 lb 12.3 oz)    SpO2 97%    BMI 25.51 kg/m2       I/O:    Intake/Output Summary (Last 24 hours) at 07/19/15 1526  Last data filed at 07/19/15 1424   Gross per 24 hour   Intake             2500 ml   Output              480 ml   Net             2020 ml       Labs:  CBC:No results for input(s): WBC, HGB, HCT, PLT in the last 168 hours. Chemistry:No results for input(s): NA, K, CL, CO2, MG, LAC in the last 168 hours.    No components found with this basename: BUN, CREATININE, GFR, GLUCOSE, CALCIUM, PHOS   General   GI/Liver:No results for input(s): AST, ALT in the last 168 hours.    No components found with this basename: TOTALBILI, ALKPHOS, AMMONIA, AMYLASE, LIPASE  Coags:No results for input(s): INR in the last 168 hours.    No components found with this basename: PROTIME, APTT    EXAM:       General: Awake, alert, NAD       Cardiovascular: RRR       HEENT: incision dry and intact, with some dried blood, and mild ecchymosis. No significant swelling.        Respiratory: CTAB        Abdomen: Soft, ND. NT       Extremities: WWP. IPCs in place. Palpable DP pulses b/l       Neuro: Grossly intact, CNII-XII intact     Assessment:   60 y.o. male with critical left ICA stenosis s/p Left CEA, doing well post-op     Plan:     - OOB to chair   - clears advance as tolerated   - PRN pain and nausea control  - continue asa/plavix   - Resume home meds   - Chemical and mechanical dvt ppx.   - Anticipate discharge to home tomorrow, if post-operative course benign     Silvestre GunnerKhurram Wednesday Ericsson, MD

## 2015-07-19 NOTE — H&P (Signed)
UPDATES TO PATIENT'S CONDITION on the DAY OF SURGERY/PROCEDURE    I. Updates to Patient's Condition (to be completed by a provider privileged to complete a H&P, following reassessment of the patient by the provider):    Day of Surgery/Procedure Update:  History  History reviewed and no change    Physical  Physical exam updated and no change      Patient seen for second opinion at the request of Dr. Smith Robertao in Cardiology. In brief, known carotid artery disease, with recent progression to severe stenosis on the RIGHT side. Is asymptomatic, specifically denies visual defect, speech disturbance, motor or sensory loss. Followed for atherosclerotic disease, and underwent recent stress echocardiogram as a workup for carotid revascularization. Maintained on aspirin and statin. No chest pain or shortness of breath with exercise, carries out all ADLs. Works in the [employer]. Right-handed.         Allergies   Allergen Reactions    Ampicillin Rash    No Known Latex Allergy          Current Outpatient Prescriptions   Medication    tadalafil (CIALIS) 5 MG tablet    testosterone cypionate (DEPO-TESTOSTERONE) 200 MG/ML injection    aspirin 81 MG EC tablet    ibuprofen (ADVIL,MOTRIN) 200 MG tablet    atorvastatin (LIPITOR) 10 MG tablet    clopidogrel (PLAVIX) 75 MG tablet     No current facility-administered medications for this visit.           Past Medical History:   Diagnosis Date    Carotid artery stenosis, asymptomatic, right     HLD (hyperlipidemia)            Past Surgical History:   Procedure Laterality Date    INGUINAL HERNIA REPAIR Right      History   Smoking Status    Never Smoker   Smokeless Tobacco    Not on file          Visit Vitals    BP 122/60  Comment: right arm    Ht 1.727 m (5\' 8" )    Wt 76.7 kg (169 lb)    BMI 25.7 kg/m2   Normal carotid upstrokes, no bruit bilaterally  No heart murmur, normal rate and rhythm  Abdomen soft, non-tender, no bruit  2+ radial pulses  bilaterally    Preop Modified Rankin Score:0 - No symptoms    Interpretation Summary   Relevant history: 7 mo f/u  Prior studies: 11/16    Right internal carotid artery (ICA)  Severe right internal carotid artery stenosis (80-99%). Endpoint visualized.  Right vertebral artery is antegrade.    Left internal carotid artery (ICA)  No significant left internal carotid artery stenosis (1-15%).   Left vertebral artery is antegrade.     Impression/Plan    High-grade carotid artery stenosis, asymptomatic. Good surgical risk, normal stress echocardiogram. Will start on Plavix for perioperative CV event risk reduction. Discussed the rationale, risks and benefits associated with open carotid revascularization, and he requests that we proceed. He has asked that I perform the operation, which I am happy to do, will discuss change with Dr. Dionicia Ableraman.      Vascular Quality Initiative- Carotid History    Hypertension or recorded B/P >= 140/90: no  Diabetes: none  CAD symptoms: none  Prior CABG: none  Prior percutaneous coronary intervention: none  Prior CHF: none  Stress test within the past 2 years?: not done  COPD: no  Dialysis: no  Last living status  before any current, acute hospitalization or rehab unit: home  Ambulatory status: ambulatory    Has the patient ever had the following?:  -Any non-cardiac arterial bypass for occlusive disease: no  -Arterial aneurysm repair: no  -Peripheral vascular intervention (any non-coronary endovascular intervention of the extremities, neck, renal, or mesenteric artery): no   -Major amputation: no    Prior neurological event: no    Prior carotid artery surgery: no  Has the patient had radiation therapy in a field including the affected carotid artery?: no  Anatomic high risk (select all that apply): no  Pre-op Modified Rankin Score: 0 - No symptoms    Imaging:    Duplex: Both right and left sides    -Right PSV: 363     -Right EDV: 140   -Right ICA/CCA ratio:  6.6   -Right side stenosis:  >80%      -Left PSV: 78   -Left EDV: 27   -Left ICA/CCA ratio: 1.07   -Left side stenosis: <=50%        CTA: no  MRA: no  Arteriogram: no    No current facility-administered medications for this encounter.        Past Medical History:   Diagnosis Date    Carotid artery stenosis, asymptomatic, right     HLD (hyperlipidemia)                             II. Procedure Readiness   I have reviewed the patient's H&P and updated condition. By completing and signing this form, I attest that this patient is ready for surgery/procedure.    III. Attestation   I have reviewed the updated information regarding the patient's condition and it is appropriate to proceed with the planned surgery/procedure.    Percell BostonMichael C Annisten Manchester, MD as of 6:08 AM 07/19/2015

## 2015-07-19 NOTE — Progress Notes (Signed)
Arterial line d/c'd, pressure held, tip intact, no hematoma, and no s/sx of infection noted.    Mingo Siegert N Edie Darley, RN

## 2015-07-19 NOTE — OR Nursing (Signed)
Greeted and interviewed pt with his wife in pre-an. Pt stated his name, d.o.b, npo status and correct surgical procedure and site per consent. He denies any limitation in range of motion, metal implant, or any known defects in skin integrity. Pt confirm allergy to Ampicillin. On pre-op neuro assessment pt demonstration normal range of motion of neck, however he mention he has some stiffness in the left side on his neck. Pt also protruded tongue midline, has equal strength bilateral hand grasp, and has equal dorsi flexion and plantar flexion of feet. OR routine explained and their questions answered.

## 2015-07-19 NOTE — Op Note (Signed)
Operative Note (Surgical Log ID: [id number])       Date of Surgery: 07/19/2015       Surgeons: Moishe SpiceSurgeon(s) and Role:     * Percell BostonStoner, Fontaine Hehl C, MD - Primary     * Silvestre Gunnerasheed, Khurram, MD - Resident - Assisting       Pre-op Diagnosis: Pre-Op Diagnosis Codes:     * Stenosis of right carotid artery [I65.21]       Post-op Diagnosis: Post-Op Diagnosis Codes:     * Stenosis of right carotid artery [I65.21]       Procedure(s) Performed: Procedures:    * Carotid Endarterectomy           Additional CPT Codes: 0454035301: Pr Thromboendartectmy Neck,Neck Incis;         Anesthesia Type: General        Fluid Totals: I/O this shift:  06/28 0700 - 06/28 1459  In: 2000 (26.3 mL/kg) [I.V.:2000]  Out: - (0 mL/kg)   Net: 2000  Weight: 76.1 kg        Estimated Blood Loss: No Data Recorded       Specimens to Pathology:  * No specimens in log *       Temporary Implants:        Packing:                 Patient Condition: good       Indications: Severe right carotid artery stenosis       Findings (Including unexpected complications): Soft yellow plaque     Description of Procedure: After verifying his consent, the patient was taken to the operating room and an anesthetic established. He was prepped about the neck, and exposure of the left carotid bifurcation was provided via an anterior sternocleidomastoid incision. The tenth and twelfth nerves were kept out of harms way, and the bifurcation dissected out. He was heparinized, and clamped. A longitudinal arteriotomy was made and a tight friable plaque excised from the left ICA. Good back bleeding and no change in the oximeter, thus a shunt was not used. We closed with a Dacron patch and after flushing maneuvers, flow was restored. Doppler insinuation showed appropriate flow, and the wound was closed in layers. I was present for the case.    Vascular Quality Initiative - Carotid Endarterectomy     Does this carotid procedure meet CEA VQI inclusion requirements?      Mark YES below if this is a  conventional or eversion endarterectomy of the carotid bifurcation that extends into the internal carotid artery. Both primary and redo operations are included.      Mark NO below if this is an isolated common or external carotid endarterectomy that does not involve the internal carotid artery, or if this is a bypass graft for carotid disease. Also mark NO if the procedure was done for an infected patch (unless a distinct endarterectomy is performed during the same surgical session.)    Yes     Type: conventional     Shunt: no    Re-explore artery after closure: no    Monitoring:   EEG: no   Stump Pressure: no   Other: yes    Completion Study:   Doppler: yes   Duplex: no   Arteriogram: no    Concomitant Procedure:   Proximal Endovascular: no   Distal Endovascular: no    CABG: no   Other Arterial Op: no    Preop Medications:     ASA taken within  36 hours prior to procedure: yes   Statin taken within 36 hours prior to procedure: yes   ACE inhibitor/ARB taken within 30 days prior to procedure: no   Pre-op P2Y12 Antagonist taken within 36 hours prior to procedure:clopidogrel   Pre-op beta blocker: no   Pre-op chronic anticoagulant taken within 30 days of the procedure: none    Signed:  Percell BostonMichael C Reinette Cuneo, MD  on 07/19/2015 at 9:13 AM

## 2015-07-19 NOTE — Anesthesia Case Conclusion (Signed)
CASE CONCLUSION  Emergence  Actions:  Suctioned, soft bite block and extubated  Criteria Used for Airway Removal:  Adequate Tv & RR, acceptable O2 saturation, following commands and sustained tetany  Assessment:  Routine  Transport  Directly to: PACU  Airway:  Nasal cannula  Oxygen Delivery:  2 lpm  Position:  Recumbent  Patient Condition on Handoff  Level of Consciousness:  Mildly sedated  Patient Condition:  Stable  Handoff Report to:  RN

## 2015-07-19 NOTE — Anesthesia Procedure Notes (Addendum)
----------------------------------------------------------------------------------------------------------------------------------------    ARTERIAL LINE     Date of Procedure: 07/19/2015 8:13 AM  PLACEMENT     Final Insertion site: left radial    Catheter Length: 20G 4.45cm     Number of attempts:  2    Other attempted sites: left radial  INDICATIONS     Indications: Hemodynamic Monitoring and Multiple ABG's    CONSENT AND TIMEOUT     Consent:  Obtained per policy    Timeout: patient identified (name/DOB) , proper patient position verified, needed equipment, monitors, medications and access verified as present and functioning , allergies reviewed with patient/record  and anticoagulation/antiplatelet status reviewed  PROCEDURE DETAILS       Patient Location: OR      Sedation used: under GA      Monitoring: full ASA monitors      Sterile Technique: hand sanitizer, sterile gloves and mask      Technique:  Arrow catheter      Placement confirmed by:  Pulsatile flow      Secured by:  Suture, tape, transparent dressing and biopatch     Lot Number: [id number]; Expiration Date: 2016-09-20  POST-PROCEDURE DETAILS     Complications:  None, well-tolerated    Patient condition at end of procedure:  No change from prior  STAFF     Performed by: extender under direct supervision    Attending Attestation: I was present for the entire procedure     Attending: Philis PiqueOLEY, Samamtha Tiegs W  Extender: POKU, Senie Lanese  ----------------------------------------------------------------------------------------------------------------------------------------

## 2015-07-19 NOTE — Progress Notes (Signed)
Report Given To  [name], RN      Descriptive Sentence / Reason for Admission   - Alejandro Dennis is a 60 y.o. male w/ right carotid stenosis - s/p 07/18/15 Right CEA        Active Issues / Relevant Events   - R neck incision - tissue adhesive  - q4 neuro  - RA, reg diet  - NSR on tele  - bedrest until 6am on 6/29      To Do List  - pain control  - goal SBP < 140      Anticipatory Guidance / Discharge Planning  6/29

## 2015-07-19 NOTE — Progress Notes (Signed)
Pt transferred from 07-1598 at approximately 1730. Report taken from Naz[place] Collegearlee, [place]RN. Pt A&Ox3, VSS, Vascular team notified of pt's arrival. Pt oriented to call bell and unit. Pt c/o 2/10 pain and repositioned. Right neck incision c/d/i. Pt currently resting comfortably with family at bedside. See doc flow & labs for details.

## 2015-07-20 ENCOUNTER — Other Ambulatory Visit: Payer: Self-pay | Admitting: Vascular Surgery

## 2015-07-20 DIAGNOSIS — I6521 Occlusion and stenosis of right carotid artery: Secondary | ICD-10-CM

## 2015-07-20 LAB — CBC AND DIFFERENTIAL
Baso # K/uL: 0 10*3/uL (ref 0.0–0.1)
Basophil %: 0.1 %
Eos # K/uL: 0 10*3/uL (ref 0.0–0.5)
Eosinophil %: 0.1 %
Hematocrit: 40 % (ref 40–51)
Hemoglobin: 14 g/dL (ref 13.7–17.5)
IMM Granulocytes #: 0 10*3/uL (ref 0.0–0.1)
IMM Granulocytes: 0.3 %
Lymph # K/uL: 1.4 10*3/uL (ref 1.3–3.6)
Lymphocyte %: 10 %
MCH: 33 pg/cell — ABNORMAL HIGH (ref 26–32)
MCHC: 35 g/dL (ref 32–37)
MCV: 94 fL — ABNORMAL HIGH (ref 79–92)
Mono # K/uL: 1 10*3/uL — ABNORMAL HIGH (ref 0.3–0.8)
Monocyte %: 7.6 %
Neut # K/uL: 11.2 10*3/uL — ABNORMAL HIGH (ref 1.8–5.4)
Nucl RBC # K/uL: 0 10*3/uL (ref 0.0–0.0)
Nucl RBC %: 0 /100 WBC (ref 0.0–0.2)
Platelets: 167 10*3/uL (ref 150–330)
RBC: 4.3 MIL/uL — ABNORMAL LOW (ref 4.6–6.1)
RDW: 12.4 % (ref 11.6–14.4)
Seg Neut %: 81.9 %
WBC: 13.6 10*3/uL — ABNORMAL HIGH (ref 4.2–9.1)

## 2015-07-20 LAB — BASIC METABOLIC PANEL
Anion Gap: 13 (ref 7–16)
CO2: 26 mmol/L (ref 20–28)
Calcium: 8.5 mg/dL — ABNORMAL LOW (ref 8.6–10.2)
Chloride: 100 mmol/L (ref 96–108)
Creatinine: 1.11 mg/dL (ref 0.67–1.17)
GFR,Black: 84 *
GFR,Caucasian: 72 *
Glucose: 149 mg/dL — ABNORMAL HIGH (ref 60–99)
Lab: 13 mg/dL (ref 6–20)
Potassium: 4 mmol/L (ref 3.3–5.1)
Sodium: 139 mmol/L (ref 133–145)

## 2015-07-20 MED ORDER — LACTOBACILLUS RHAMNOSUS (GG) PO CAPS *I*
1.0000 | ORAL_CAPSULE | Freq: Every day | ORAL | 0 refills | Status: AC
Start: 2015-07-20 — End: 2015-08-03

## 2015-07-20 MED ORDER — ACETAMINOPHEN 500 MG PO TABS *I*
1000.0000 mg | ORAL_TABLET | Freq: Three times a day (TID) | ORAL | 0 refills | Status: DC | PRN
Start: 2015-07-20 — End: 2018-09-02

## 2015-07-20 MED ORDER — DOCUSATE SODIUM 100 MG PO CAPS *I*
100.0000 mg | ORAL_CAPSULE | Freq: Two times a day (BID) | ORAL | 0 refills | Status: AC | PRN
Start: 2015-07-20 — End: 2015-08-19

## 2015-07-20 MED ORDER — DOCUSATE SODIUM 100 MG PO CAPS *I*
100.0000 mg | ORAL_CAPSULE | Freq: Two times a day (BID) | ORAL | Status: DC | PRN
Start: 2015-07-20 — End: 2015-07-20

## 2015-07-20 MED ORDER — LACTOBACILLUS RHAMNOSUS (GG) PO CAPS *I*
1.0000 | ORAL_CAPSULE | Freq: Every day | ORAL | Status: DC
Start: 2015-07-20 — End: 2015-07-20
  Administered 2015-07-20: 1 via ORAL
  Filled 2015-07-20: qty 1

## 2015-07-20 NOTE — Progress Notes (Signed)
Pt deemed adequate for discharge to home. IV's removed.  Diet, walking program, wound care, medication administration, weight restrictions, daily showers, and follow-up care reviewed with patient. Pt acknowledges understanding of discharge instructions and denied having any other further questions. Pt transported off of unit in wheelchair via transport to home.  Pt is dressed appropriately for weather and has all belongings with him.

## 2015-07-20 NOTE — Discharge Instructions (Signed)
Brief Summary of your Hospital Stay (including key procedures and diagnostic test results):  On 07/19/2015 [facility]oid [facility] and underwent a carotid endarterectomy. You tolerated the procedure well. Your incision site has no hematoma ( blood fluid collection), no redness, bruising or drainage. The wound is covered with Dermabond and open to air. You report no current headaches and are neurologically intact. Blood pressure remained under SBP 140- this is your discharge goal. You have progressed tolerating your diet, urinating and have adequate pain control on medications.     On 07/20/2015 your vital signs are stable and you have been discharged to home. After discharge, you are to follow up with your primary care doctor and Vascular Surgery in clinic with scheduled appointments.     Pending Labs: Surgical pathology    Call Dr. Soyla MurphyMichael Stoner [phone] for :   Fever of 101F. or greater  Shaking chills  Nausea and / or vomiting  Uncontrolled pain  Any Headache, TIA or Stroke symptoms  Increased redness, drainage or swelling from the right neck incision site or any other questions or concern.   If you cannot reach the provider above, call [phone] Vascular Surgery Clinic any time 24/7 and ask for the vascular surgeon on call.    Other Instructions: None     Diet: Heart healthy; Low sodium, Low cholesterol and Low fat    Activity: Walking is encouraged  Do not lift heavy objects greater than 10 lbs  Keep head elevated on 2-3 pillows or use recliner when resting to reduce risk of swelling to right neck site.    Wound Care: Wash incision with antibacterial soap  Take a shower, pat incision dry - right neck site may be open to air. You have dermabond that will come off over time- do not pick or peal.   Do not bathe or immerse in water for 2 weeks    Pain Management: Per medication list below.   It is important to keep your pain controlled so that you are able to take deep breaths and  cough. However, the use of a narcotic pain medication can be constipating. Please continue to walk, drink plenty of fluids, and use an over the counter stool softener such as Colace (Docusate Sodium) and/or Senna as needed for constipation. Do not operate heavy machinery, drive, or make important decisions while on narcotic pain medications because narcotics may impair your reflex speed and judgement. You may wean your narcotic pain medicines as tolerated to regular over-the-counter pain medicines such as acetaminophen (Tylenol - avoid if you have liver disease) or ibuprofen (Motrin, Advil - avoid if you have kidney disease). Be careful not to exceed 4000mg  of acetaminophen in a day from all sources (Tylenol with Codeine, Norco and Percocet contain acetaminophen).    If you have received sedative medication and/or general anesthesia they may make you drowsy for as long as 24 hours:  A) DO NOT drive or operate any machinery for 24 hours  B) DO NOT drink alcoholic beverages for 24 hours  C) DO NOT make major decisions, sign contracts, etc. for 24 hours    Smoking: Smoking can reduce the quality of your wound healing and increase your chances of wound infections, as well as increase your chances of developing chronic health problems or worsen conditions you already have. If you smoke, you should quit. Smoking cessation information is available for your review to help you quit. Medications to help you quit are available.  Ask your doctor (Penird, Kaylyn LayerKevin D, MD) if you would like to receive these medications.    Diabetes: If you are diabetic, check your blood sugar at least daily. Poor blood sugar control can lead to delayed wound healing. Follow up with Penird, Kaylyn LayerKevin D, MD to discuss your diabetic care regimen.    Hand washing:  Hand Washing is your first line of defense against infection in the hospital, out shopping, and at home. In an effort to Prevent  Infection, especially at your incision site(s), please continue to  wash your hands with antibacterial soap and water  or  use the alcohol based hand sanitizers frequently.  Keeping a bottle of hand sanitizer near you at all times during your recovery period can save you from a very serious complication if you use it as suggested.  It is especially important to remember to wash your hands before and after eating, using the bathroom, and taking care of your surgical site. Anyone who is helping you at home should be doing the same thing all of the time, especially if they are helping you in any way with your personal care , dressings, or meal preparation. This can be the beginning of developing some very good habits for you, your friends and loved ones! They care and we care too!    Prescriptions Ordered: There are rarely paper Rxs/perscriptions handed out  any longer.   Because we use the computer to order most of them now, Please, make sure you know where to pick up any prescriptions that may have been ordered for you electronically.  We want them filled and available for you to take them at home as you have been directed by your Nurse at your time of discharge from the hospital.    .Nursing Education:  Written patient education materials reviewed/given to patient: carotid endarterectomy, low salt/low sodium diet. Low cholesterol and low fat diet    To prevent risk of infection, especially by incision sites, continue to wash your hands frequently, especially after using the bathroom and when soiled.

## 2015-07-20 NOTE — Progress Notes (Signed)
Interdisciplinary Rounds Note    Date: 07/20/2015   Time: 9:42 AM   Attendance:  Care Coordinator, Nurse Practitioner, Physician, Registered Nurse and Social Worker    Admit Date/Time:  07/19/2015  5:53 AM    Principal Problem: Carotid stenosis, asymptomatic, right  Problem List:   Patient Active Problem List    Diagnosis Date Noted    L ICA stenosis s/p L CEA 6/28 06/16/2015    Cervical radiculopathy 06/25/2010       The patient's problem list and interdisciplinary care plan was reviewed.    Discharge Planning  Lives in: Multi-level home  Location of bedroom: 2nd level  Location of bathroom: 2nd level  Lives With: Spouse  Can they assist with pt needs after discharge?: Yes  *Does patient currently have home care services?: No     *Current External Services: None  Current Home Equipment: None     Medical Supplies Available: Not Applicable  Equipment/Supplies Ordered: None     *5.) Delivery of D/C DME/medical supplies confirmed, if applicable: Not Applicable    Plan : Spoke with pt confirmed demographics. Per pt he lives with his wife who will be able to assist with needs. Pt was independent with ADL's and ambulation prior to admission, working full time. Per Desiree NP pt does not require home care services. Pt aware his prescriptions were sent to his pharmacy for pick up.    Anticipated Discharge Date:07/20/15  Expected Discharge Date: 07/20/15  Discharge Disposition: Home

## 2015-07-20 NOTE — Discharge Summary (Signed)
Name: Alejandro Dennis MRN: [id number] DOB: 02-Apr-1955     Admit Date: 07/19/2015   Date of Discharge: 07/20/2015    Patient was accepted for discharge to home              Discharge Attending Physician: Percell BostonSTONER, MICHAEL C      Hospitalization Summary    CONCISE NARRATIVE:   Name: Alejandro Dennis    MRN: [id number]  DOB: 02-Apr-1955  Admit Date: 07/19/2015  Date of Discharge: 07/20/15    Discharge Attending Physician: Percell BostonStoner, Michael C, MD    Hospitalization Summary    CONCISE NARRATIVE: Alejandro Dennis is 60 y.o. year old male admitted on 07/19/2015 for asymptomatic right carotid artery stenosis.  On  07/19/15, Alejandro Dennis had a RIGHT carotid endartectomy.   Postoperatively, the patient experienced no new neurologic events. Patient remains neurologically intact. No cranial nerve injury present at discharge. Other complications included: none.      Upon discharge, Alejandro Dennis was experiencing no pain and tolerating a heart healthy diet.      Modified Rankin score at discharge = 0 - No symptoms.    Carotid endartectomy site: no evidence of hematoma, open to air, no  Hematoma, erythema or ecchymosis.       Alejandro Dennis was discharged home and will follow up with PCP and Vascular Surgery as scheduled.    VQI Discharge Medications:  Is the patient being discharged on the following?     ASA: yes  Statin: yes  ACE inhibitor/ARB: no  P2Y12 Antagonist: clopidogrel  Beta blocker: no  Anticoagulant: none      Diagnoses for this hospitalization include:  R carotid stenosis s/p 07/19/15 R CEA              OR PROCEDURE: 07/19/15 R CEA       Signed: Erin SonsESIREE M Nadav Swindell, NP  On: 07/20/2015  at: 9:24 AM

## 2015-07-20 NOTE — Progress Notes (Signed)
Vascular Surgery Daily Progress Note    Subjective:     Patient doing well POD1. Headache resolved overnight.        Objective:      BP: (99-134)/(57-94)   Temp:  [36 C (96.8 F)-37.2 C (99 F)]   Temp src: Temporal (06/29 0400)  Heart Rate:  [69-94]   Resp:  [13-20]   SpO2:  [96 %-100 %]   Height:  [172.7 cm (5\' 8" )]   Weight:  [76.1 kg (167 lb 12.3 oz)]     Intake/Output  I/O this shift:  06/28 2300 - 06/29 0659  In: 400 (5.3 mL/kg) [P.O.:400]  Out: 1000 (13.1 mL/kg) [Urine:1000]  Net: -600  Weight: 76.1 kg     CMP    Recent Labs  Lab 07/20/15  0122   Sodium 139   Potassium 4.0   Chloride 100   CO2 26   Creatinine 1.11       No components found with this basename: BUN, LABGLOM, GLUCOSE, CALCIUM,  LAC    Coags  No results for input(s): INR, PTT in Alejandro last 168 hours.    No components found with this basename: APTT    CBC    Recent Labs  Lab 07/20/15  0122   WBC 13.6*   Hemoglobin 14.0   Hematocrit 40   Platelets 167       Physical Exam:      General appearance: healthy and alert  HEENT: PERRLA and extra ocular movement intact  CV: regular rate and rhythm, S1, S2 normal, no murmur, click, rub or gallop  Lungs:clear  Abd: soft, non-tender. Bowel sounds normal. No masses,  no organomegaly  Extremities:  R DP palpable  R PT palpable  Motor/sensory intact    L DP palpable  L PT palpable  Motor/sensory intact    Neuro: normal without focal findings, mental status, speech normal, alert and oriented x3, PERLA and cranial nerves 2-12 intact. Motor strength 5/5 BUE and BLE. Sensory intact.     Assessment:   Alejandro BargeDavid Dennis is a 60 y.o. male POD1 for L CEA.      Plan:     E/F:  Replete PRN  Diet: clears advance as tolerated   Cardiac: lipitor  Pulmonary: none  GI: zofran prn  Endocrine: none  Uro: none  Abx: none  Anti-platelet: aspirin, plavix  Pain control: dilaudid and oxycodone prn  Dressing/incision/wound plan: evaluate daily  DVT prophylaxis: heprain SQ    Overall plan for Alejandro day: dispo planning      Diagnoses for this  hospitalization include:    ICA Stenosis    Please page Alejandro vascular surgery resident on call for any questions.  Melchor Amourahim Jaire Pinkham, MD   Vascular Surgery   07/20/2015 at 5:52 AM

## 2015-07-20 NOTE — Addendum Note (Signed)
Addendum  created 07/20/15 0908 by Philis Piqueooley, Rasmus Preusser W, MD    Sign clinical note

## 2015-07-20 NOTE — Anesthesia Postprocedure Evaluation (Signed)
Anesthesia Post-Op Note    Patient: Alejandro Dennis    Procedure(s) Performed:  Procedure Summary     Date Anesthesia Start Anesthesia Stop Room / Location    07/19/15 0728 [id number] [facility]       Procedure Diagnosis Surgeon Attending Anesthesia    Carotid Endarterectomy     (Right Neck) Stenosis of right carotid artery  (Stenosis of right carotid artery [I65.21]) Percell BostonStoner, Michael C, MD Philis Piqueooley, Yosselyn Tax W, MD        Recovery Vitals  BP: 116/73 (07/20/2015  7:53 AM)  Heart Rate: 88 (07/20/2015  7:53 AM)  Heart Rate (via Pulse Ox): 90 (07/19/2015  5:10 PM)  Resp: 20 (07/20/2015  7:53 AM)  Temp: 36.1 C (96.9 F) (07/20/2015  7:53 AM)  SpO2: 96 % (07/20/2015  7:53 AM)  O2 Device: None (Room air) (07/20/2015  4:00 AM)  O2 Flow Rate: 2 L/min (07/19/2015  4:00 PM)   0-10 Scale: 1 (07/20/2015  7:53 AM)  Anesthesia type:  General  Complications Noted During Procedure or in PACU:  None   Comment:   The patient feels well, has excellent pain control, and is tolerating a diet.  He notes a mild sore throat.  He denies nausea or intra-operative awareness.      Attending Attestation:  All indicated post anesthesia care provided       Complications Noted During Recovery Period:      None  Recovery from Anesthesia:      Recovered to baseline level of consciousness  Condition of patient:      Recovered to pre-anesthetic condition and Satisfactory

## 2015-07-20 NOTE — Plan of Care (Signed)
Pt remained free of falls throughout hospital stay. Used call bell appropriately. Non-skid footwear, cleared to be independent. Tolerating diet well, no issues. Minimal pain relieved with scheduled Tylenol.

## 2015-07-24 ENCOUNTER — Telehealth: Payer: Self-pay

## 2015-07-24 NOTE — Telephone Encounter (Signed)
PT called over the weekend on 07/22/15 at 10:48am and left message w/ [employer] stating he has an uncontrollable cough since procedure from 6/28.

## 2015-07-24 NOTE — Telephone Encounter (Signed)
Returned call to patient - notes return call by attending MD over the weekend, cough greatly improved. Has routine post-op visit with PCP today. Urged to call with any other questions/concerns.

## 2015-07-28 ENCOUNTER — Telehealth: Payer: Self-pay

## 2015-07-28 NOTE — Telephone Encounter (Signed)
PT called us b/c he would like reassurance if it's normal to have the area north of the incision from his surgery to be tough. Stating it is hard to swallow and has had a terrible cough since the surgery.

## 2015-07-28 NOTE — Telephone Encounter (Signed)
Called and spoke with patient - he reports cough at nighttime while supine, no dysphagia - overall has bee steadily improving. Reports numbness along jawline/incision -informed patient this is typical and will dissipate over the next several weeks. He denies any edema over or near incision or difficulty breathing. Patient informed to go to ED if he develops any progressively worsening incisional edema, difficulty swallowing or breathing.

## 2015-08-03 ENCOUNTER — Telehealth: Payer: Self-pay

## 2015-08-03 NOTE — Telephone Encounter (Signed)
Returned call to [employer]. S/p CEA on 6/28. Patient asking if okay to use sunscreen on surgical site. After discussing with PA, advised that small amount sunscreen would be fine as long as incision site is closed and there is no drainage or irritation. Patient denies these things. Will use small amount of sunscreen over incision.

## 2015-08-03 NOTE — Telephone Encounter (Signed)
Alejandro Dennis left a message with the answering service that he has some questions from his surgery/procedure he had done with Dr. Elwyn LadeStoner a a few weeks ago. Please give him a call back.

## 2015-08-07 ENCOUNTER — Other Ambulatory Visit: Payer: Self-pay | Admitting: Vascular Surgery

## 2015-08-07 MED ORDER — CLOPIDOGREL BISULFATE 75 MG PO TABS *I*
75.0000 mg | ORAL_TABLET | Freq: Every day | ORAL | 0 refills | Status: DC
Start: 2015-08-07 — End: 2016-08-20

## 2015-08-15 ENCOUNTER — Ambulatory Visit: Admit: 2015-08-15 | Discharge: 2015-08-15 | Disposition: A | Discharge status: Home / Self Care | Payer: Self-pay

## 2015-08-15 ENCOUNTER — Ambulatory Visit: Payer: Self-pay | Admitting: Vascular Surgery

## 2015-08-15 ENCOUNTER — Encounter: Payer: Self-pay | Admitting: Vascular Surgery

## 2015-08-15 ENCOUNTER — Ambulatory Visit
Admission: RE | Admit: 2015-08-15 | Discharge: 2015-08-15 | Disposition: A | Discharge status: Home / Self Care | Payer: Self-pay | Source: Ambulatory Visit | Attending: Vascular Surgery | Admitting: Vascular Surgery

## 2015-08-15 VITALS — BP 118/82 | Ht 68.0 in | Wt 167.0 lb

## 2015-08-15 DIAGNOSIS — I6521 Occlusion and stenosis of right carotid artery: Secondary | ICD-10-CM

## 2015-08-15 LAB — CV US CAROTID BILATERAL
Left Carotid Bulb EDV: 15.36 cm/s
Left Carotid Bulb PSV: 47.65 cm/s
Left Common Carotid Artery EDV Dist: 21.47 cm/s
Left Common Carotid Artery EDV Prox: 18.24 cm/s
Left Common Carotid Artery PSV Dist: 65.11 cm/s
Left Common Carotid Artery PSV Prox: 70.98 cm/s
Left External Carotid Artery EDV: 21.96 cm/s
Left External Carotid Artery PSV: 114.02 cm/s
Left ICA/CCA Ratio: 1.17
Left Internal Carotid Artery EDV Dist: 24.08 cm/s
Left Internal Carotid Artery EDV Mid: 22.33 cm/s
Left Internal Carotid Artery EDV Prox: 21.54 cm/s
Left Internal Carotid Artery PSV Dist: 55.49 cm/s
Left Internal Carotid Artery PSV Mid: 53.74 cm/s
Left Internal Carotid Artery PSV Prox: 76.49 cm/s
Left Subclavian Artery EDV: 15.56 cm/s
Left Subclavian Artery PSV: 133.74 cm/s
Left Vertebral Artery EDV: 5.91 cm/s
Left Vertebral Artery PSV: 23 cm/s
Right Carotid Bulb EDV: 13.08 cm/s
Right Carotid Bulb PSV: 70.22 cm/s
Right Common Carotid Artery EDV Dist: 26.67 cm/s
Right Common Carotid Artery EDV Prox: 19.67 cm/s
Right Common Carotid Artery PSV Dist: 114.73 cm/s
Right Common Carotid Artery PSV Prox: 72.4 cm/s
Right External Carotid Artery EDV: 22.45 cm/s
Right External Carotid Artery PSV: 104.82 cm/s
Right ICA/CCA Ratio: 0.41
Right Internal Carotid Artery EDV Dist: 17.51 cm/s
Right Internal Carotid Artery EDV Mid: 22.5 cm/s
Right Internal Carotid Artery EDV Prox: 14.88 cm/s
Right Internal Carotid Artery PSV Dist: 49.55 cm/s
Right Internal Carotid Artery PSV Mid: 50.98 cm/s
Right Internal Carotid Artery PSV Prox: 46.91 cm/s
Right Subclavian Artery EDV: 14.97 cm/s
Right Subclavian Artery PSV: 192.23 cm/s
Right Vertebral Artery EDV: 7.27 cm/s
Right Vertebral Artery PSV: 26.53 cm/s

## 2015-08-15 NOTE — Progress Notes (Signed)
1 month post-procedure did well from RCEA, did well and home post-op day 1. No pain, back to full activity.    Allergies   Allergen Reactions    Ampicillin Rash    No Known Latex Allergy      Current Outpatient Prescriptions   Medication    acetaminophen (TYLENOL) 500 mg tablet    docusate sodium (COLACE) 100 MG capsule    testosterone cypionate (DEPO-TESTOSTERONE) 200 MG/ML injection    aspirin 81 MG EC tablet    ibuprofen (ADVIL,MOTRIN) 200 MG tablet    atorvastatin (LIPITOR) 10 MG tablet    clopidogrel (PLAVIX) 75 MG tablet     No current facility-administered medications for this visit.      Visit Vitals    BP 118/82  Comment: rigth arm    Ht 1.727 m (5\' 8" )    Wt 75.8 kg (167 lb)    BMI 25.39 kg/m2   Normal carotid upstroke  Right cervical incision well-healed    Carotid Measurements   Right Carotid   Right Common Carotid Artery PSV Prox 72.40 cm/s      Right Common Carotid Artery EDV Prox 19.67 cm/s      Right Common Carotid Artery PSV Dist 114.73 cm/s      Right Common Carotid Artery EDV Dist 26.67 cm/s      Right Carotid Bulb PSV 70.22 cm/s      Right Carotid Bulb EDV 13.08 cm/s      Right Internal Carotid Artery PSV Prox 46.91 cm/s      Right Internal Carotid Artery EDV Prox 14.88 cm/s      Right Internal Carotid Artery PSV Mid 50.98 cm/s      Right Internal Carotid Artery EDV Mid 22.50 cm/s      Right Internal Carotid Artery PSV Dist 49.55 cm/s      Right Internal Carotid Artery EDV Dist 17.51 cm/s      Right External Carotid Artery PSV 104.82 cm/s      Right External Carotid Artery EDV 22.45 cm/s      Right Vertebral Artery PSV 26.53 cm/s      Right Vertebral Artery EDV 7.27 cm/s      Right Subclavian Artery PSV 192.23 cm/s      Right Subclavian Artery EDV 14.97 cm/s       Left Carotid   Left Common Carotid Artery PSV Prox 70.98 cm/s      Left Common Carotid Artery EDV Prox 18.24 cm/s      Left Common Carotid Artery PSV Dist 65.11 cm/s      Left Common Carotid Artery EDV Dist 21.47 cm/s       Left Carotid Bulb PSV 47.65 cm/s      Left Carotid Bulb EDV 15.36 cm/s      Left Internal Carotid Artery PSV Prox 76.49 cm/s      Left Internal Carotid Artery EDV Prox 21.54 cm/s      Left Internal Carotid Artery PSV Mid 53.74 cm/s      Left Internal Carotid Artery EDV Mid 22.33 cm/s      Left Internal Carotid Artery PSV Dist 55.49 cm/s      Left Internal Carotid Artery EDV Dist 24.08 cm/s      Left External Carotid Artery PSV 114.02 cm/s      Left External Carotid Artery EDV 21.96 cm/s      Left Vertebral Artery PSV 23.00 cm/s      Left Vertebral Artery EDV 5.91 cm/s  Left Subclavian Artery PSV 133.74 cm/s      Left Subclavian Artery EDV 15.56 cm/s           Impression/Plan    Maintain anti-platelet therapy and statin. Ok to change to aspirin alone today. Follow-up 6 months. No restrictions.     S/P carotid endarterectomy, doing well and no evidence of hemodynamic residual disease

## 2015-10-12 ENCOUNTER — Other Ambulatory Visit: Payer: Self-pay | Admitting: Pediatrics

## 2015-10-12 LAB — COMPREHENSIVE METABOLIC PANEL
ALT: 33 U/L (ref 0–50)
AST: 31 U/L (ref 0–50)
Albumin: 4.1 g/dL (ref 3.5–5.2)
Alk Phos: 26 U/L — ABNORMAL LOW (ref 40–130)
Anion Gap: 11 ^^L (ref 7–16)
Bilirubin,Total: 1.2 mg/dL (ref 0.0–1.2)
CO2: 27 mmol/L (ref 20–28)
Calcium: 8.9 mg/dL (ref 8.6–10.2)
Chloride: 103 mmol/L (ref 96–108)
Creatinine: 1.15 mg/dL (ref 0.67–1.17)
GFR,Black: 79 mL/min/{1.73_m2}
GFR,Caucasian: 65 mL/min/{1.73_m2}
Glucose: 91 mg/dL (ref 60–99)
Lab: 12 mg/dL (ref 6–20)
Potassium: 4.4 mmol/L (ref 3.4–4.7)
Sodium: 141 mmol/L (ref 133–145)
Total Protein: 6.8 g/dL (ref 6.3–7.7)

## 2015-10-12 LAB — LIPID PANEL
Chol/HDL Ratio: 2.8 ^^L (ref 1.0–3.5)
Cholesterol: 179 mg/dL (ref 0–199)
HDL: 63 mg/dL — ABNORMAL HIGH (ref 40–60)
LDL Calculated: 86 mg/dL (ref 0–129)
Triglycerides: 152 mg/dL — ABNORMAL HIGH (ref 0–149)

## 2015-10-13 LAB — TESTOSTERONE: Testosterone: 696 ng/dL (ref 193–740)

## 2016-02-20 ENCOUNTER — Encounter: Payer: Self-pay | Admitting: Vascular Surgery

## 2016-02-20 ENCOUNTER — Ambulatory Visit: Payer: Self-pay | Admitting: Vascular Surgery

## 2016-02-20 ENCOUNTER — Ambulatory Visit
Admission: RE | Admit: 2016-02-20 | Discharge: 2016-02-20 | Disposition: A | Discharge status: Home / Self Care | Payer: Self-pay | Source: Ambulatory Visit | Attending: Vascular Surgery | Admitting: Vascular Surgery

## 2016-02-20 ENCOUNTER — Ambulatory Visit: Payer: Self-pay

## 2016-02-20 VITALS — BP 132/80 | Ht 68.0 in | Wt 167.0 lb

## 2016-02-20 DIAGNOSIS — I6521 Occlusion and stenosis of right carotid artery: Secondary | ICD-10-CM

## 2016-02-20 LAB — CV US CAROTID BILATERAL
Left Carotid Bulb EDV: 11.52 cm/s
Left Carotid Bulb PSV: 36.13 cm/s
Left Common Carotid Artery EDV Dist: 22.45 cm/s
Left Common Carotid Artery EDV Prox: 19.22 cm/s
Left Common Carotid Artery PSV Dist: 87.05 cm/s
Left Common Carotid Artery PSV Prox: 66.06 cm/s
Left External Carotid Artery EDV: 18.4 cm/s
Left External Carotid Artery PSV: 133.62 cm/s
Left ICA/CCA Ratio: 0.9
Left Internal Carotid Artery EDV Dist: 20.59 cm/s
Left Internal Carotid Artery EDV Mid: 27.06 cm/s
Left Internal Carotid Artery EDV Prox: 26.76 cm/s
Left Internal Carotid Artery PSV Dist: 52.96 cm/s
Left Internal Carotid Artery PSV Mid: 55.55 cm/s
Left Internal Carotid Artery PSV Prox: 78.07 cm/s
Left Subclavian Artery EDV: 0 cm/s
Left Subclavian Artery PSV: 137.67 cm/s
Left Vertebral Artery EDV: 8.64 cm/s
Left Vertebral Artery PSV: 33.94 cm/s
Right Carotid Bulb EDV: 22.45 cm/s
Right Carotid Bulb PSV: 119.35 cm/s
Right Common Carotid Artery EDV Dist: 23.18 cm/s
Right Common Carotid Artery EDV Prox: 15.27 cm/s
Right Common Carotid Artery PSV Dist: 95.69 cm/s
Right Common Carotid Artery PSV Prox: 74.6 cm/s
Right External Carotid Artery EDV: 15.99 cm/s
Right External Carotid Artery PSV: 95.13 cm/s
Right ICA/CCA Ratio: 0.7
Right Internal Carotid Artery EDV Dist: 27.06 cm/s
Right Internal Carotid Artery EDV Mid: 25.77 cm/s
Right Internal Carotid Artery EDV Prox: 16.7 cm/s
Right Internal Carotid Artery PSV Dist: 72.38 cm/s
Right Internal Carotid Artery PSV Mid: 59.44 cm/s
Right Internal Carotid Artery PSV Prox: 67.2 cm/s
Right Subclavian Artery EDV: 0 cm/s
Right Subclavian Artery PSV: 165.19 cm/s
Right Vertebral Artery EDV: 12.84 cm/s
Right Vertebral Artery PSV: 50.47 cm/s

## 2016-02-20 NOTE — Progress Notes (Addendum)
Vascular Surgery Outpatient Clinic Follow-Up Visit    HPI:     Alejandro Dennis is a 61 y.o. male s/p 07/19/15 R CEA. Continues on aspirin / statin - Plavix discontinued 07/2015 after 1 month of therapy. Reports numbness around incision site- slightly improved from discharge and now   Stable. Pt reports feeling well.   Denies SOB, CP, N/V, fever/ chills, HA, amaurosis fugax, or TIA/ CVA s/s. Exercises daily and eats a heart healthy diet. Planning to train for a 1/2 marathon June 2018.     Presents today for 6 month follow up w/ carotid US.       REVIEW OF SYSTEMS  ROS   Review of Systems - General ROS: negative   Respiratory ROS: no cough, shortness of breath, or wheezing  Cardiovascular ROS: no chest pain or dyspnea on exertion  Gastrointestinal ROS: no abdominal pain, change in bowel habits, or black or bloody stools  Genito-Urinary ROS: no dysuria, trouble voiding, or hematuria  Musculoskeletal ROS: negative  Neurological ROS: no TIA or stroke symptoms; neports R neck numbness by incision site, improved - stable     MEDICAL HISTORY  Past Medical History:   Diagnosis Date    Carotid artery stenosis, asymptomatic, right     HLD (hyperlipidemia)        SURGICAL HISTORY  Past Surgical History:   Procedure Laterality Date    INGUINAL HERNIA REPAIR Right 2010       FAMILY HISTORY  Family History   Problem Relation Age of Onset    Other Father      Carotid artery stenosis s/p BCEA    Lung cancer Father     Stroke Neg Hx     Heart attack Neg Hx     Heart surgery Neg Hx     Diabetes Neg Hx         SOCIAL HISTORY   reports that he has never smoked. He has never used smokeless tobacco. He reports that he drinks about 3.0 oz of alcohol per week  He reports that he does not use illicit drugs.     ALLERGIES  Ampicillin and No known latex allergy     MEDICATIONS  Current Outpatient Prescriptions   Medication Sig    clopidogrel (PLAVIX) 75 MG tablet Take 1 tablet (75 mg total) by mouth daily    acetaminophen (TYLENOL) 500  mg tablet Take 2 tablets (1,000 mg total) by mouth 3 times daily as needed for Pain    testosterone cypionate (DEPO-TESTOSTERONE) 200 MG/ML injection INJECT 400MG  ( ) INTRAMUSCULAR EVERY 2.5 WEEKS (CODE F) MDD 400/2.5W    aspirin 81 MG EC tablet Take 81 mg by mouth every morning       ibuprofen (ADVIL,MOTRIN) 200 MG tablet Take 600 mg by mouth every 6 hours as needed   Takes 3 pills at a time     atorvastatin (LIPITOR) 10 MG tablet Take 10 mg by mouth every other day         No current facility-administered medications for this visit.         Objective:      Vitals:    02/20/16 0849   BP: 132/80   Weight: 75.8 kg (167 lb)   Height: 1.727 m (5\' 8" )         Imaging:  02/19/16:   Interpretation Summary   Relevant history:  6 mo f/u Rt CEA  Prior studies: 07/2015    Right internal carotid artery (ICA)  No significant right  internal carotid artery stenosis (1-15%), s/p CEA.    Right vertebral artery is antegrade.     Left internal carotid artery (ICA)  No significant left internal carotid artery stenosis (1-15%).   Left vertebral artery is antegrade.    Stable exam from 07/2015.          Physical Exam:      General appearance: healthy, alert, no distress and cooperative  HEENT: PERRLA, extra ocular movement intact, sclera clear, anicteric, neck supple with midline trachea and no cartoid bruit bilaterally ; R neck incision site healed.   CV: regular rate and rhythm  Lungs: CTA   Abd: soft, non-tender. Bowel sounds normal. No masses,  no organomegaly   Extremities: warm, dry   Pulses:  Right Radial: Normal  Left Radial: Normal  Right Dorsalis Pedis: Normal  Left Dorsalis Pedis: Normal  Right Posterior Tibial: Normal  Left Posterior Tibial: Normal    Neuro: normal without focal findings, mental status, speech normal, alert and oriented x3, PERLA and reflexes normal and symmetric; lack of discrimination around R neck incision site.     Assessment:   Alejandro Dennis is a 61 y.o. male s/p 07/19/15 R CEA. 02/19/16 carotid  imaging demonstrates, no significant stenosis ( 1-15%) bilaterally.        Plan:   - Imaging reviewed with patient  - Pt to continue on aspirin and statin; heart healthy diet, and daily exercise for atherosclerotic risk modification.   - Pt may use warm compress/ heating pad and stretching for comfort as needed on R neck site   - Pt to follow up in Vascular Surgery Clinic in 6 months for carotid US and clinic visit - then yearly thereafter with no changes  - Encouraged pt to call 911 or go to the nearest ED with any TIA/ CVA s/s  - Call Vascular Surgery clinic with any questions or concerns.     - Pt assessed and care plan discussed with Dr. Soyla MurphyMichael Stoner.       Erin SonsESIREE M Koral Thaden, NP  02/20/2016 at 8:55 AM

## 2016-04-22 ENCOUNTER — Other Ambulatory Visit: Payer: Self-pay | Admitting: General Practice

## 2016-04-22 LAB — INR FFT: INR: 0.91 ^^L (ref 0.84–1.12)

## 2016-04-22 LAB — CBC
Hematocrit: 46 % (ref 40.1–51.0)
Hemoglobin: 15.4 g/dL (ref 13.7–17.5)
MCH: 30.9 pg (ref 25.7–32.2)
MCHC: 33.5 g/dL (ref 32.3–36.5)
MCV: 92.4 fL — ABNORMAL HIGH (ref 79.0–92.2)
Nucl RBC # K/uL: 0 /100 WBC (ref 0.0–0.2)
Platelets: 201 10*3/uL (ref 163–337)
RBC Distribution Width-SD: 40.8 fL (ref 35.1–43.9)
RBC: 4.98 10*6/uL (ref 4.63–6.08)
RDW: 12.1 % (ref 11.6–14.4)
WBC: 6.1 10*3/uL (ref 4.2–9.1)

## 2016-05-13 ENCOUNTER — Other Ambulatory Visit: Payer: Self-pay | Admitting: Pediatrics

## 2016-05-13 LAB — COMPREHENSIVE METABOLIC PANEL
ALT: 40 U/L (ref 0–50)
AST: 34 U/L (ref 0–50)
Albumin: 4.6 g/dL (ref 3.5–5.2)
Alk Phos: 33 U/L — ABNORMAL LOW (ref 40–130)
Anion Gap: 13 ^^L (ref 7–16)
Bilirubin,Total: 1.8 mg/dL — ABNORMAL HIGH (ref 0.0–1.2)
CO2: 30 mmol/L — ABNORMAL HIGH (ref 20–28)
Calcium: 9.4 mg/dL (ref 8.6–10.2)
Chloride: 98 mmol/L (ref 96–108)
Creatinine: 1.2 mg/dL — ABNORMAL HIGH (ref 0.67–1.17)
GFR,Black: 75 mL/min/{1.73_m2}
GFR,Caucasian: 62 mL/min/{1.73_m2}
Glucose: 91 mg/dL (ref 60–99)
Lab: 22 mg/dL — ABNORMAL HIGH (ref 6–20)
Potassium: 4.2 mmol/L (ref 3.4–4.7)
Sodium: 141 mmol/L (ref 133–145)
Total Protein: 7.9 g/dL — ABNORMAL HIGH (ref 6.3–7.7)

## 2016-05-13 LAB — LIPID PANEL
Chol/HDL Ratio: 3.2 ^^L (ref 1.0–3.5)
Cholesterol: 243 mg/dL — ABNORMAL HIGH (ref 0–199)
HDL: 77 mg/dL — ABNORMAL HIGH (ref 40–60)
LDL Calculated: 139 mg/dL — ABNORMAL HIGH (ref 0–129)
Triglycerides: 136 mg/dL (ref 0–149)

## 2016-05-14 LAB — TESTOSTERONE: Testosterone: 149 ng/dL — ABNORMAL LOW (ref 193–740)

## 2016-07-23 ENCOUNTER — Ambulatory Visit: Payer: Self-pay | Admitting: Cardiology

## 2016-07-25 ENCOUNTER — Telehealth: Payer: Self-pay | Admitting: Cardiology

## 2016-07-25 NOTE — Telephone Encounter (Signed)
Patient called stating he is too busy to make 7/9 revisit and states he will call back to reschedule. Appointment has been cancelled

## 2016-07-29 ENCOUNTER — Ambulatory Visit: Payer: Self-pay | Admitting: Cardiology

## 2016-08-20 ENCOUNTER — Ambulatory Visit: Payer: PRIVATE HEALTH INSURANCE | Attending: Vascular Surgery | Admitting: Vascular Surgery

## 2016-08-20 ENCOUNTER — Encounter: Payer: Self-pay | Admitting: Vascular Surgery

## 2016-08-20 ENCOUNTER — Ambulatory Visit
Admission: RE | Admit: 2016-08-20 | Discharge: 2016-08-20 | Disposition: A | Discharge status: Home / Self Care | Payer: PRIVATE HEALTH INSURANCE | Source: Ambulatory Visit | Attending: Vascular Surgery | Admitting: Vascular Surgery

## 2016-08-20 VITALS — BP 120/70 | Ht 68.0 in | Wt 167.0 lb

## 2016-08-20 DIAGNOSIS — I779 Disorder of arteries and arterioles, unspecified: Secondary | ICD-10-CM | POA: Insufficient documentation

## 2016-08-20 DIAGNOSIS — Z48812 Encounter for surgical aftercare following surgery on the circulatory system: Secondary | ICD-10-CM

## 2016-08-20 DIAGNOSIS — I6521 Occlusion and stenosis of right carotid artery: Secondary | ICD-10-CM

## 2016-08-20 NOTE — Progress Notes (Signed)
Vascular Surgery Outpatient Clinic Follow-Up Visit    HPI:     Alejandro Dennis is a 61 y.o. male with PMH significant for HLD and cervical spine stenosis, who is seen in the Vascular Surgery outpatient clinic for routine follow-up. Patient underwent right carotid endarterectomy performed by Dr. Soyla MurphyMichael Stoner on 07/19/15 for asymptomatic critical stenosis.     Alejandro HuaDavid denies any interval symptoms concerning for stroke, TIA, or amaurosis fugax. He denies any symptoms consistent with mesenteric angina or claudication.    Patient's medications, allergies, and medical, surgical, family, and social histories were updated, as appropriate, in eRecord during today's office visit.    Vascular risk factors:   Age: 61 y.o.  Male gender: yes  Diabetes mellitus: no  Obesity: no, Body mass index is 25.39 kg/(m^2).  Hypertension: no  Hyperlipidemia: yes  Tobacco abuse: no  Family history: yes  VQI Info:  Occupation: Self-Employed  Ambulatory status: Independent  Current living status: Home  HCP: Unknown  Code status: Full Comorbidities:  Coronary artery disease: no  Renal disease: no  Lung disease: no  Arrhythmia: no  Prior CVA: no  CHF: no  Prior PCI: no  Prior open heart surgery: no  Liver disease: no  Coagulopathy: no     REVIEW OF SYSTEMS  ROS - Pertinent items noted in HPI.    MEDICAL HISTORY  Past Medical History:   Diagnosis Date    Carotid artery stenosis, asymptomatic, right     HLD (hyperlipidemia)        SURGICAL HISTORY  Past Surgical History:   Procedure Laterality Date    Carotid endarterectomy Right 07/19/2015    INGUINAL HERNIA REPAIR Right 2010       FAMILY HISTORY  Family History   Problem Relation Age of Onset    Other Father      Carotid artery stenosis s/p BCEA    Lung cancer Father     Stroke Neg Hx     Heart attack Neg Hx     Heart surgery Neg Hx     Diabetes Neg Hx         SOCIAL HISTORY   reports that he has never smoked. He has never used smokeless tobacco. He reports that he drinks about 3.0 oz of  alcohol per week  He reports that he does not use illicit drugs.     ALLERGIES  Ampicillin and No known latex allergy     MEDICATIONS  Current Outpatient Prescriptions   Medication Sig    testosterone cypionate (DEPO-TESTOSTERONE) 200 MG/ML injection INJECT 400MG  (2ML) INTRAMUSCULAR EVERY 2.5 WEEKS (CODE F) MDD 400/2.5W    aspirin 81 MG EC tablet Take 81 mg by mouth every morning       atorvastatin (LIPITOR) 10 MG tablet Take 10 mg by mouth every other day        acetaminophen (TYLENOL) 500 mg tablet Take 2 tablets (1,000 mg total) by mouth 3 times daily as needed for Pain    ibuprofen (ADVIL,MOTRIN) 200 MG tablet Take 600 mg by mouth every 6 hours as needed   Takes 3 pills at a time      No current facility-administered medications for this visit.         Objective:      BP 120/70 Comment: right arm   Ht 1.727 m (5\' 8" )   Wt 75.8 kg (167 lb)   BMI 25.39 kg/m2     Imaging:  Right Internal Carotid Artery (ICA):   No significant  right internal carotid artery stenosis (1-15%), s/p right carotid endarterectomy.     Right vertebral artery is antegrade.    Left Internal Carotid Artery (ICA):   No significant left internal carotid artery stenosis (1-15%).    Left vertebral artery is antegrade.    Physical Exam:      General appearance: healthy, alert, active and no distress  HEENT: Normocephalic, atraumatic  CV: regular rate and rhythm and no carotid bruit, well healed right neck incision  Lungs: CTA bilaterally, respirations unlabored  Extremities: Bilateral lower extremities are warm and well perfused without any evidence of clubbing, cyanosis, or limb ischemia. Intact peripheral pulses. No edema.   Pulses:  Right Radial: Normal  Left Radial: Normal  Right Popliteal: Normal  Left Popliteal: Normal  Right Posterior Tibial: Normal  Left Posterior Tibial: Normal  Neuro: normal without focal findings, mental status, speech normal, alert and oriented x3 and sensation grossly normal    Assessment:   Alejandro Dennis is a  61 y.o. male s/p right CEA for asymptomatic critical ICA stenosis, doing well.    Plan:      Results of the imaging studies were reviewed with the patient.   Recommend continued medical management and surveillance of carotid artery stenosis, no indication for further surgical intervention at this time.   Continued risk factor modification: daily antiplatelet and statin therapy.   RTC 1 year - carotid duplex followed by office visit.   Seek immediate medical attention with any symptoms concerning for stroke or TIA.   Phone clinic with any other questions/concerns prior to next office visit.    Azzie RoupElizabeth A Wight-Regna, NP  08/20/2016 at 9:10 AM

## 2016-08-21 LAB — CV US CAROTID BILATERAL
Left Carotid Bulb EDV: 13.84 cm/s
Left Carotid Bulb PSV: 73.17 cm/s
Left Common Carotid Artery EDV Dist: 18.24 cm/s
Left Common Carotid Artery EDV Prox: 21.5 cm/s
Left Common Carotid Artery PSV Dist: 72.07 cm/s
Left Common Carotid Artery PSV Prox: 101.78 cm/s
Left External Carotid Artery EDV: 17.12 cm/s
Left External Carotid Artery PSV: 123.71 cm/s
Left ICA/CCA Ratio: 0.82
Left Internal Carotid Artery EDV Dist: 24.09 cm/s
Left Internal Carotid Artery EDV Mid: 20.6 cm/s
Left Internal Carotid Artery EDV Prox: 21.47 cm/s
Left Internal Carotid Artery PSV Dist: 59 cm/s
Left Internal Carotid Artery PSV Mid: 52.89 cm/s
Left Internal Carotid Artery PSV Prox: 59 cm/s
Left Subclavian Artery EDV: 13.59 cm/s
Left Subclavian Artery PSV: 121.92 cm/s
Left Vertebral Artery EDV: 11.29 cm/s
Left Vertebral Artery PSV: 33.22 cm/s
Right Carotid Bulb EDV: 18.91 cm/s
Right Carotid Bulb PSV: 101.78 cm/s
Right Common Carotid Artery EDV Dist: 28.43 cm/s
Right Common Carotid Artery EDV Prox: 18.57 cm/s
Right Common Carotid Artery PSV Dist: 122.1 cm/s
Right Common Carotid Artery PSV Prox: 71.3 cm/s
Right External Carotid Artery EDV: 18.73 cm/s
Right External Carotid Artery PSV: 125.33 cm/s
Right ICA/CCA Ratio: 0.49
Right Internal Carotid Artery EDV Dist: 20.36 cm/s
Right Internal Carotid Artery EDV Mid: 18.84 cm/s
Right Internal Carotid Artery EDV Prox: 20.6 cm/s
Right Internal Carotid Artery PSV Dist: 53.11 cm/s
Right Internal Carotid Artery PSV Mid: 54.61 cm/s
Right Internal Carotid Artery PSV Prox: 59.87 cm/s
Right Subclavian Artery EDV: 18.41 cm/s
Right Subclavian Artery PSV: 176.84 cm/s
Right Vertebral Artery EDV: 11.82 cm/s
Right Vertebral Artery PSV: 42.43 cm/s

## 2016-09-20 ENCOUNTER — Other Ambulatory Visit: Payer: Self-pay | Admitting: Pediatrics

## 2016-09-20 LAB — COMPREHENSIVE METABOLIC PANEL
ALT: 49 U/L (ref 0–50)
AST: 40 U/L (ref 0–50)
Albumin: 4.7 g/dL (ref 3.5–5.2)
Alk Phos: 36 U/L — ABNORMAL LOW (ref 40–130)
Anion Gap: 15 (ref 7–16)
Bilirubin,Total: 1.6 mg/dL — ABNORMAL HIGH (ref 0.0–1.2)
CO2: 28 mmol/L (ref 20–28)
Calcium: 9.3 mg/dL (ref 8.6–10.2)
Chloride: 101 mmol/L (ref 96–108)
Creatinine: 1.13 mg/dL (ref 0.67–1.17)
GFR,Black: 80 mL/min/{1.73_m2}
GFR,Caucasian: 66 mL/min/{1.73_m2}
Glucose: 96 mg/dL (ref 60–99)
Lab: 19 mg/dL (ref 6–20)
Potassium: 4.6 mmol/L (ref 3.4–4.7)
Sodium: 144 mmol/L (ref 133–145)
Total Protein: 7.8 g/dL — ABNORMAL HIGH (ref 6.3–7.7)

## 2016-09-20 LAB — LIPID PANEL
Chol/HDL Ratio: 3 (ref 1.0–3.5)
Cholesterol: 207 mg/dL — ABNORMAL HIGH (ref 0–199)
HDL: 69 mg/dL — ABNORMAL HIGH (ref 40–60)
LDL Calculated: 117 mg/dL (ref 0–129)
Triglycerides: 106 mg/dL (ref 0–149)

## 2016-09-21 LAB — TESTOSTERONE: Testosterone: 165 ng/dL — ABNORMAL LOW (ref 193–740)

## 2017-02-14 ENCOUNTER — Other Ambulatory Visit
Admission: RE | Admit: 2017-02-14 | Discharge: 2017-02-14 | Disposition: A | Discharge status: Home / Self Care | Payer: PRIVATE HEALTH INSURANCE | Source: Ambulatory Visit | Attending: Pediatrics | Admitting: Pediatrics

## 2017-02-14 DIAGNOSIS — E7849 Other hyperlipidemia: Secondary | ICD-10-CM | POA: Insufficient documentation

## 2017-02-14 DIAGNOSIS — E298 Other testicular dysfunction: Secondary | ICD-10-CM | POA: Insufficient documentation

## 2017-02-14 LAB — LIPID PANEL
Chol/HDL Ratio: 3.3
Cholesterol: 218 mg/dL — AB
HDL: 66 mg/dL
LDL Calculated: 117 mg/dL
Non HDL Cholesterol: 152 mg/dL
Triglycerides: 176 mg/dL — AB

## 2017-02-14 LAB — COMPREHENSIVE METABOLIC PANEL
ALT: 50 U/L (ref 0–50)
AST: 41 U/L (ref 0–50)
Albumin: 4.7 g/dL (ref 3.5–5.2)
Alk Phos: 34 U/L — ABNORMAL LOW (ref 40–130)
Anion Gap: 15 (ref 7–16)
Bilirubin,Total: 2.3 mg/dL — ABNORMAL HIGH (ref 0.0–1.2)
CO2: 26 mmol/L (ref 20–28)
Calcium: 9.3 mg/dL (ref 8.6–10.2)
Chloride: 101 mmol/L (ref 96–108)
Creatinine: 1.25 mg/dL — ABNORMAL HIGH (ref 0.67–1.17)
GFR,Black: 72 *
GFR,Caucasian: 62 *
Glucose: 95 mg/dL (ref 60–99)
Lab: 17 mg/dL (ref 6–20)
Potassium: 4.9 mmol/L (ref 3.3–5.1)
Sodium: 142 mmol/L (ref 133–145)
Total Protein: 7.1 g/dL (ref 6.3–7.7)

## 2017-02-14 LAB — TESTOSTERONE: Testosterone: 262 ng/dL (ref 193–740)

## 2017-03-07 ENCOUNTER — Ambulatory Visit
Admission: RE | Admit: 2017-03-07 | Discharge: 2017-03-07 | Disposition: A | Discharge status: Home / Self Care | Payer: BLUE CROSS/BLUE SHIELD | Source: Ambulatory Visit | Attending: Pediatrics | Admitting: Pediatrics

## 2017-03-07 ENCOUNTER — Ambulatory Visit: Payer: BLUE CROSS/BLUE SHIELD | Admitting: Radiology

## 2017-03-07 ENCOUNTER — Other Ambulatory Visit: Payer: Self-pay | Admitting: Pediatrics

## 2017-03-07 DIAGNOSIS — R0781 Pleurodynia: Secondary | ICD-10-CM

## 2017-03-07 DIAGNOSIS — R079 Chest pain, unspecified: Secondary | ICD-10-CM

## 2017-03-07 DIAGNOSIS — J069 Acute upper respiratory infection, unspecified: Secondary | ICD-10-CM | POA: Insufficient documentation

## 2017-10-23 ENCOUNTER — Other Ambulatory Visit: Payer: Self-pay | Admitting: Vascular Surgery

## 2017-10-23 DIAGNOSIS — I6521 Occlusion and stenosis of right carotid artery: Secondary | ICD-10-CM

## 2017-10-28 ENCOUNTER — Ambulatory Visit: Payer: BLUE CROSS/BLUE SHIELD | Admitting: Vascular Surgery

## 2017-10-28 ENCOUNTER — Other Ambulatory Visit: Payer: BLUE CROSS/BLUE SHIELD

## 2017-11-03 ENCOUNTER — Telehealth: Payer: Self-pay | Admitting: Vascular Surgery

## 2017-11-03 ENCOUNTER — Ambulatory Visit
Admission: RE | Admit: 2017-11-03 | Discharge: 2017-11-03 | Disposition: A | Discharge status: Home / Self Care | Payer: BLUE CROSS/BLUE SHIELD | Source: Ambulatory Visit | Attending: Vascular Surgery | Admitting: Vascular Surgery

## 2017-11-03 DIAGNOSIS — I6521 Occlusion and stenosis of right carotid artery: Secondary | ICD-10-CM | POA: Insufficient documentation

## 2017-11-03 LAB — CV US CAROTID BILATERAL
Left Carotid Bulb EDV: 23.73 cm/s
Left Carotid Bulb PSV: 76.47 cm/s
Left Common Carotid Artery EDV Dist: 20.11 cm/s
Left Common Carotid Artery EDV Prox: 19.01 cm/s
Left Common Carotid Artery PSV Dist: 87.13 cm/s
Left Common Carotid Artery PSV Prox: 90.42 cm/s
Left External Carotid Artery EDV: 15.5 cm/s
Left External Carotid Artery PSV: 109.18 cm/s
Left ICA/CCA Ratio: 0.74
Left Internal Carotid Artery EDV Dist: 25.83 cm/s
Left Internal Carotid Artery EDV Mid: 35.82 cm/s
Left Internal Carotid Artery EDV Prox: 23.73 cm/s
Left Internal Carotid Artery PSV Dist: 58.13 cm/s
Left Internal Carotid Artery PSV Mid: 78.67 cm/s
Left Internal Carotid Artery PSV Prox: 64.38 cm/s
Left Subclavian Artery EDV: 0 cm/s
Left Subclavian Artery PSV: 146.93 cm/s
Left Vertebral Artery EDV: 6.36 cm/s
Left Vertebral Artery PSV: 25 cm/s
Right Carotid Bulb EDV: 8.04 cm/s
Right Carotid Bulb PSV: 50.75 cm/s
Right Common Carotid Artery EDV Dist: 26.7 cm/s
Right Common Carotid Artery EDV Prox: 21.53 cm/s
Right Common Carotid Artery PSV Dist: 105.8 cm/s
Right Common Carotid Artery PSV Prox: 98.44 cm/s
Right External Carotid Artery EDV: 15.02 cm/s
Right External Carotid Artery PSV: 83.65 cm/s
Right ICA/CCA Ratio: 0.43
Right Internal Carotid Artery EDV Dist: 29.92 cm/s
Right Internal Carotid Artery EDV Mid: 26.55 cm/s
Right Internal Carotid Artery EDV Prox: 21.78 cm/s
Right Internal Carotid Artery PSV Dist: 67.44 cm/s
Right Internal Carotid Artery PSV Mid: 56.44 cm/s
Right Internal Carotid Artery PSV Prox: 45.27 cm/s
Right Subclavian Artery EDV: 0 cm/s
Right Subclavian Artery PSV: 158.75 cm/s
Right Vertebral Artery EDV: 9.33 cm/s
Right Vertebral Artery PSV: 32.11 cm/s

## 2017-11-03 NOTE — Telephone Encounter (Signed)
Attempted to reach patient, no answer. LM reviewing result of ultrasound, RTC 1 year for carotid duplex lab only. Urged to call if any questions/concerns.

## 2017-11-03 NOTE — Telephone Encounter (Signed)
-----   Message from Sallee Provencal sent at 11/03/2017  3:41 PM EDT -----  This patient is refusing office visit.  He cannot make it on Tuesdays.        S/P right CEA 06/2015.  1-15% B/L today.  I set him up for a 12 month follow up carotid US.    Should he be coming every 6 months?

## 2017-11-19 ENCOUNTER — Encounter: Payer: Self-pay | Admitting: Gastroenterology

## 2017-11-19 ENCOUNTER — Other Ambulatory Visit
Admission: RE | Admit: 2017-11-19 | Discharge: 2017-11-19 | Disposition: A | Discharge status: Home / Self Care | Payer: BLUE CROSS/BLUE SHIELD | Source: Ambulatory Visit | Attending: Pediatrics | Admitting: Pediatrics

## 2017-11-19 DIAGNOSIS — E7849 Other hyperlipidemia: Secondary | ICD-10-CM | POA: Insufficient documentation

## 2017-11-19 DIAGNOSIS — E298 Other testicular dysfunction: Secondary | ICD-10-CM | POA: Insufficient documentation

## 2017-11-19 LAB — LIPID PANEL
Chol/HDL Ratio: 2.9
Cholesterol: 223 mg/dL — AB
HDL: 78 mg/dL — ABNORMAL HIGH (ref 40–60)
LDL Calculated: 117 mg/dL
Non HDL Cholesterol: 145 mg/dL
Triglycerides: 142 mg/dL

## 2017-11-19 LAB — COMPREHENSIVE METABOLIC PANEL
ALT: 60 U/L — ABNORMAL HIGH (ref 0–50)
AST: 43 U/L (ref 0–50)
Albumin: 4.8 g/dL (ref 3.5–5.2)
Alk Phos: 33 U/L — ABNORMAL LOW (ref 40–130)
Anion Gap: 13 (ref 7–16)
Bilirubin,Total: 1.5 mg/dL — ABNORMAL HIGH (ref 0.0–1.2)
CO2: 25 mmol/L (ref 20–28)
Calcium: 9.5 mg/dL (ref 8.6–10.2)
Chloride: 102 mmol/L (ref 96–108)
Creatinine: 1.23 mg/dL — ABNORMAL HIGH (ref 0.67–1.17)
GFR,Black: 73 *
GFR,Caucasian: 63 *
Glucose: 95 mg/dL (ref 60–99)
Lab: 15 mg/dL (ref 6–20)
Potassium: 4.6 mmol/L (ref 3.3–5.1)
Sodium: 140 mmol/L (ref 133–145)
Total Protein: 7.4 g/dL (ref 6.3–7.7)

## 2017-11-19 LAB — TESTOSTERONE: Testosterone: 277 ng/dL (ref 193–740)

## 2018-03-02 ENCOUNTER — Other Ambulatory Visit: Payer: Self-pay | Admitting: Pediatrics

## 2018-03-02 ENCOUNTER — Ambulatory Visit
Admission: RE | Admit: 2018-03-02 | Discharge: 2018-03-02 | Disposition: A | Discharge status: Home / Self Care | Payer: BLUE CROSS/BLUE SHIELD | Source: Ambulatory Visit | Attending: Pediatrics | Admitting: Pediatrics

## 2018-03-02 DIAGNOSIS — J069 Acute upper respiratory infection, unspecified: Secondary | ICD-10-CM | POA: Insufficient documentation

## 2018-07-03 ENCOUNTER — Telehealth: Payer: Self-pay | Admitting: Cardiology

## 2018-07-03 ENCOUNTER — Encounter: Payer: Self-pay | Admitting: Cardiology

## 2018-07-03 ENCOUNTER — Inpatient Hospital Stay: Admit: 2018-07-03 | Discharge: 2018-07-03 | Disposition: A | Discharge status: Home / Self Care | Payer: Self-pay

## 2018-07-03 NOTE — Telephone Encounter (Signed)
General Cardiology or EP?: GC  Diagnosis: severe arterial sacrosis    Name of Caller: patient  Relationship to Patient: self  Phone Number: [phone]    Referring Provider: self ref    PCP Name: Lovena Le D  PCP Phone Number and Location: [phone]    Does the patient have a previous cardiologist?: Y  If yes,  Cardiologist Name: KR  Cardiologist Phone Number and Location: [facility]    Has the patient had any cardiac testing(EKG, Echo, Stress Testing ,etc)? N  If yes,  Location: n/a    Has the patient had any recent hospitalizations? N  If yes,  Hospital Name: n/a    Other Comments:

## 2018-07-09 ENCOUNTER — Ambulatory Visit: Payer: BLUE CROSS/BLUE SHIELD | Admitting: Cardiology

## 2018-07-09 ENCOUNTER — Other Ambulatory Visit: Payer: Self-pay | Admitting: Cardiology

## 2018-07-09 ENCOUNTER — Encounter: Payer: Self-pay | Admitting: Cardiology

## 2018-07-09 VITALS — BP 140/90 | HR 81 | Ht 68.0 in | Wt 164.0 lb

## 2018-07-09 DIAGNOSIS — I6523 Occlusion and stenosis of bilateral carotid arteries: Secondary | ICD-10-CM

## 2018-07-09 DIAGNOSIS — I2510 Atherosclerotic heart disease of native coronary artery without angina pectoris: Secondary | ICD-10-CM

## 2018-07-09 DIAGNOSIS — E785 Hyperlipidemia, unspecified: Secondary | ICD-10-CM

## 2018-07-09 DIAGNOSIS — Z9189 Other specified personal risk factors, not elsewhere classified: Secondary | ICD-10-CM

## 2018-07-09 NOTE — Progress Notes (Addendum)
Cardiology Office Revisit Note    Date of Visit: 07/09/2018 Patient: Alejandro Dennis   Patients PCP: Jetty PeeksPenird, Kevin D, MD Patient DOB: September 27, 1955     Subjective/Reason For Visit     I had the pleasure of seeing Alejandro Dennis in cardiology followup on 07/09/2018.     Alejandro Dennis is a 63 year old male who has been demonstrated to have evidence of premature atherosclerotic vascular disease as required carotid endarterectomy on the right side in 2017 for severe right ICA stenosis.  I saw him several years ago for routine cardiovascular prevention.  He lost to follow-up with me since then.  Recently he was seen by orthopedic surgeon Dr. Lyn HollingsheadAlexander for right hip and right leg pains.  He had x-ray of the right hip joint and was incidentally discovered to have right femoral artery severe calcification.  He self-referred to me for further cardiac workup.  Alejandro Dennis otherwise is quite active and in fact runs 4-5 miles quite frequently with no ongoing symptoms of chest pain and dyspnea or palpitations or lightheadedness.  He reports that he is doing well otherwise.   He also has hyperlipidemia.      Past Medical History:   Diagnosis Date    Carotid artery stenosis, asymptomatic, right     HLD (hyperlipidemia)      Past Surgical History:   Procedure Laterality Date    Carotid endarterectomy Right 07/19/2015    INGUINAL HERNIA REPAIR Right 2010     ROS     Constitutional symptoms negative  HEENT unremarkable  Weight gain/loss is absent  GI unremarkable  Respiratory unremarkable  Cardiac as described above  GU unremarkable  Musculoskeletal right hip pain as described above  Hematological unremarkable  Endocrine unremarkable  Neurological unremarkable  Dermatological none  Psychiatric none      Medications     Current Outpatient Medications   Medication Sig    testosterone cypionate (DEPO-TESTOSTERONE) 200 MG/ML injection INJECT 400MG  (2ML) INTRAMUSCULAR EVERY 2.5 WEEKS (CODE F) MDD 400/2.5W    aspirin 81 MG EC tablet Take 81 mg by  mouth every morning       atorvastatin (LIPITOR) 10 MG tablet Take 10 mg by mouth every other day        acetaminophen (TYLENOL) 500 mg tablet Take 2 tablets (1,000 mg total) by mouth 3 times daily as needed for Pain    ibuprofen (ADVIL,MOTRIN) 200 MG tablet Take 600 mg by mouth every 6 hours as needed   Takes 3 pills at a time      Vitals and Physical Exam     Jadd's  height is 1.727 m (5\' 8" ) and weight is 74.4 kg (164 lb). His blood pressure is 140/90 and his pulse is 81.  Body mass index is 24.94 kg/m.    Physical Exam     Healthy-appearing male with repeat blood pressure 120/84    Constitutional: Patient is oriented to person, place, and time. Appears well-developed and well-nourished.   HENT:   Head: Normocephalic.   Eyes: Conjunctivae are normal. No scleral icterus.   Neck: Neck supple. No JVD present. No thyromegaly present and there is evidence of right carotid endarterectomy scar.  Cardiovascular: Normal rate, regular rhythm, normal heart sounds and intact distal pulses.  Exam reveals no gallop.    No murmur heard.  Both radials excellent.  Both femorals are bounding with 4+ distal pulsations bilaterally.  No bruits audible.  Pulmonary/Chest: Effort normal and breath sounds normal with no wheezing or rales.  Abdominal: Soft with no mass.   Musculoskeletal: Normal range of motion with no edema.   Neurological: alert and oriented to person, place, and time.   Skin: Skin is warm.   Psychiatric: With normal mood and affect.   Vitals reviewed.      Laboratory Data     Hematology:   No results found for requested labs within last 730 days.     Chemistry:   Results in Past 730 Days  Result Component Current Result Previous Result   Sodium 140 (11/19/2017) 142 (02/14/2017)   Potassium 4.6 (11/19/2017) 4.9 (02/14/2017)   Creatinine 1.23 (H) (11/19/2017) 1.25 (H) (02/14/2017)   Glucose 95 (11/19/2017) 95 (02/14/2017)   Calcium 9.5 (11/19/2017) 9.3 (02/14/2017)   AST 43 (11/19/2017) 41 (02/14/2017)   ALT 60 (H)  (11/19/2017) 50 (02/14/2017)     Coagulation Studies:   No results found for requested labs within last 730 days.     Cardiac:   No results found for requested labs within last 730 days.     Lipids:   Results in Past 730 Days  Result Component Current Result Previous Result   Cholesterol 223 (!) (11/19/2017) 218 (!) (02/14/2017)   HDL 78 (H) (11/19/2017) 66 (02/14/2017)   Triglycerides 142 (11/19/2017) 176 (!) (02/14/2017)   LDL Calculated 117 (11/19/2017) 117 (02/14/2017)   Chol/HDL Ratio 2.9 (11/19/2017) 3.3 (02/14/2017)     Cardiac/Imaging Data & Risk Scores     ECG: Normal sinus rhythm with minor T abnormalities inferior leads.  Compared to an EKG from July 12, 2015 no significant change is noted.         Exercise Stress Echo Complete 06/29/2015    Narrative  The left ventricular ejection fraction is normal.   The echo response is negative for ischemia.   The patient's functional capacity is excellent.   The blood pressure response to exercise was hypertensive.   There is mild aortic regurgitation.                         CV US Carotid Bilateral 11/03/2017    Narrative History:  S/P Right CEA 07/19/2015  Prior studies: 08/20/2016    Right internal carotid artery (ICA)  No significant right internal carotid artery stenosis (1-15%).  S/P   carotid artery endarterectomy.   Right vertebral artery is antegrade.    Left internal carotid artery (ICA)  No significant left internal carotid artery stenosis (1-15%)  Left vertebral artery is antegrade.    * Description of procedure: sonographic evaluation of the bilateral   extracranial carotid arteries was performed using B-mode, color and   spectral doppler imaging.            Impression and Plan     Patient Active Problem List   Diagnosis Code    Cervical radiculopathy M54.12    L ICA stenosis s/p L CEA 6/28 I65.21    Bilateral carotid artery disease I73.9       This is an 63 y.o. male with with known carotid vessel disease status post carotid endarterectomy on right side  with good follow-up carotid Doppler study in 2017 no documented to have right femoral artery extensive calcification and routine hip x-ray.  He is asymptomatic with no ongoing symptoms of claudication pain.  In fact he runs 4-5 miles daily with no limitations.      Since he has been documented to have premature atherosclerotic vascular disease it would be prudent for him to be  evaluated further despite lack of symptoms.  I have ordered coronary calcium scoring for further risk stratification.  Her stress echo within the last 3 years showing no ischemia and normal LVEF.  If he were found to have coronary calcium scores far below 99% he could be a candidate for aggressive risk factor modification which needs to be done anyway.    In that case I would consider switching him to a different statin therapy in place of atorvastatin aiming to maintain LDL below 70 range despite excellent HDL.  His blood pressure is adequately controlled.    Femoral artery calcification incidental finding with excellent femoral and distal pulsations.  I have ordered Doppler study of the lower extremities to rule out significant stenosis involving the lower extremity arteries.    I haven't made any changes in his medications.    I will see him in follow-up in 3-4 weeks with the Doppler study of the lower extremities and coronary artery calcium scores.    I reviewed all this information and counseled him on cardiovascular prevention in detail.    Thank you for allowing me to participate in the care of this pleasant patient.      Jess BartersKRISHNA M Tennile Styles, MD  Electronically signed on 07/09/2018 at 1:55 PM.

## 2018-07-10 ENCOUNTER — Ambulatory Visit
Admission: RE | Admit: 2018-07-10 | Discharge: 2018-07-10 | Disposition: A | Discharge status: Home / Self Care | Source: Ambulatory Visit | Attending: Vascular Surgery | Admitting: Vascular Surgery

## 2018-07-10 ENCOUNTER — Other Ambulatory Visit: Payer: Self-pay | Admitting: Cardiology

## 2018-07-10 DIAGNOSIS — E785 Hyperlipidemia, unspecified: Secondary | ICD-10-CM

## 2018-07-10 DIAGNOSIS — I2510 Atherosclerotic heart disease of native coronary artery without angina pectoris: Secondary | ICD-10-CM | POA: Insufficient documentation

## 2018-07-10 DIAGNOSIS — I6523 Occlusion and stenosis of bilateral carotid arteries: Secondary | ICD-10-CM

## 2018-07-10 DIAGNOSIS — I70213 Atherosclerosis of native arteries of extremities with intermittent claudication, bilateral legs: Secondary | ICD-10-CM

## 2018-07-11 LAB — EKG 12-LEAD
P: 70 deg
PR: 141 ms
QRS: 86 deg
QRSD: 101 ms
QT: 348 ms
QTc: 404 ms
Rate: 81 {beats}/min
T: 108 deg

## 2018-07-13 ENCOUNTER — Telehealth: Payer: Self-pay

## 2018-07-13 LAB — CV US LOWER EXTREMITY ARTERIES BILATERAL
Left CFA EDV: 13.27 cm/s
Left CFA PSV: 86.08 cm/s
Left CIA EDV Prox: 19 cm/s
Left CIA PSV Prox: 82 cm/s
Left Common Iliac Artery Prox EDV: 19.23 cm/s
Left Common Iliac Artery Prox PSV: 81.49 cm/s
Left EIA EDV Prox: 14 cm/s
Left EIA PSV Prox: 94 cm/s
Left External Iliac Artery Prox EDV: 13.73 cm/s
Left External Iliac Artery Prox PSV: 94.31 cm/s
Left PFA EDV: 8.9 cm/s
Left PFA PSV: 65.53 cm/s
Left Peroneal Artery EDV Dist: 0 cm/s
Left Peroneal Artery EDV Mid: 0 cm/s
Left Peroneal Artery EDV Prox: 0 cm/s
Left Peroneal Artery PSV Dist: 28.35 cm/s
Left Peroneal Artery PSV Mid: 28.35 cm/s
Left Peroneal Artery PSV Prox: 41.22 cm/s
Left Popliteal Artery EDV Dist: 6.22 cm/s
Left Popliteal Artery EDV Mid: 7.51 cm/s
Left Popliteal Artery EDV Prox: 9.38 cm/s
Left Popliteal Artery PSV Dist: 41.18 cm/s
Left Popliteal Artery PSV Mid: 34.7 cm/s
Left Popliteal Artery PSV Prox: 51.45 cm/s
Left Posterior Tibial Artery EDV Dist: 8.82 cm/s
Left Posterior Tibial Artery EDV Mid: 6.24 cm/s
Left Posterior Tibial Artery EDV Prox: 7.52 cm/s
Left Posterior Tibial Artery PSV Dist: 46.44 cm/s
Left Posterior Tibial Artery PSV Mid: 43.93 cm/s
Left Posterior Tibial Artery PSV Prox: 45.11 cm/s
Left SFA EDV Dist: 7.77 cm/s
Left SFA EDV Mid: 9.1 cm/s
Left SFA EDV Prox: 13.25 cm/s
Left SFA PSV Dist: 51.45 cm/s
Left SFA PSV Mid: 172.5 cm/s
Left SFA PSV Prox: 87.58 cm/s
Right Common Femoral Artery EDV: 12.82 cm/s
Right Common Femoral Artery PSV: 89.19 cm/s
Right Common Iliac Artery EDV Prox: 14 cm/s
Right Common Iliac Artery PSV Prox: 78 cm/s
Right Common Iliac Artery Prox EDV: 13.73 cm/s
Right Common Iliac Artery Prox PSV: 77.83 cm/s
Right External Iliac Artery EDV Prox: 15 cm/s
Right External Iliac Artery PSV Prox: 144 cm/s
Right External Iliac Artery Prox EDV: 14.55 cm/s
Right External Iliac Artery Prox PSV: 143.91 cm/s
Right Peroneal Artery EDV Dist: 0 cm/s
Right Peroneal Artery EDV Mid: 4.8 cm/s
Right Peroneal Artery EDV Prox: 6.54 cm/s
Right Peroneal Artery PSV Dist: 23.99 cm/s
Right Peroneal Artery PSV Mid: 28.35 cm/s
Right Peroneal Artery PSV Prox: 34.46 cm/s
Right Popliteal Artery EDV Dist: 7.14 cm/s
Right Popliteal Artery EDV Mid: 11.03 cm/s
Right Popliteal Artery EDV Prox: 10.65 cm/s
Right Popliteal Artery PSV Dist: 47.38 cm/s
Right Popliteal Artery PSV Mid: 48.68 cm/s
Right Popliteal Artery PSV Prox: 57.38 cm/s
Right Posterior Tibial Artery EDV Dist: 9.74 cm/s
Right Posterior Tibial Artery EDV Mid: 8.44 cm/s
Right Posterior Tibial Artery EDV Prox: 9.74 cm/s
Right Posterior Tibial Artery PSV Dist: 48.68 cm/s
Right Posterior Tibial Artery PSV Mid: 44.79 cm/s
Right Posterior Tibial Artery PSV Prox: 55.17 cm/s
Right Profunda Femoral Artery EDV: 9.32 cm/s
Right Profunda Femoris Artery PSV: 65 cm/s
Right Superficial Femoral Artery EDV Dist: 8.04 cm/s
Right Superficial Femoral Artery EDV Mid: 9.32 cm/s
Right Superficial Femoral Artery EDV Prox: 9.32 cm/s
Right Superficial Femoral Artery PSV Dist: 61.24 cm/s
Right Superficial Femoral Artery PSV Mid: 79.25 cm/s
Right Superficial Femoral Artery PSV Prox: 80.52 cm/s

## 2018-07-13 NOTE — Telephone Encounter (Signed)
-----   Message from Hennie Duos, MD sent at 07/13/2018 12:56 PM EDT -----  Please call with good Doppler study of the lower extremities.  Thank you

## 2018-07-13 NOTE — Telephone Encounter (Signed)
S/w pt, discussed the results he has no additional questions at this time.

## 2018-07-20 ENCOUNTER — Inpatient Hospital Stay: Admit: 2018-07-20 | Discharge: 2018-07-20 | Disposition: A | Discharge status: Home / Self Care | Payer: Self-pay

## 2018-07-21 ENCOUNTER — Other Ambulatory Visit
Admission: RE | Admit: 2018-07-21 | Discharge: 2018-07-21 | Disposition: A | Discharge status: Home / Self Care | Payer: BLUE CROSS/BLUE SHIELD | Source: Ambulatory Visit | Attending: Pediatrics | Admitting: Pediatrics

## 2018-07-21 DIAGNOSIS — E7849 Other hyperlipidemia: Secondary | ICD-10-CM | POA: Insufficient documentation

## 2018-07-21 LAB — COMPREHENSIVE METABOLIC PANEL
ALT: 47 U/L (ref 0–50)
AST: 36 U/L (ref 0–50)
Albumin: 4.6 g/dL (ref 3.5–5.2)
Alk Phos: 30 U/L — ABNORMAL LOW (ref 40–130)
Anion Gap: 12 (ref 7–16)
Bilirubin,Total: 1.4 mg/dL — ABNORMAL HIGH (ref 0.0–1.2)
CO2: 27 mmol/L (ref 20–28)
Calcium: 9 mg/dL (ref 8.6–10.2)
Chloride: 100 mmol/L (ref 96–108)
Creatinine: 1.19 mg/dL — ABNORMAL HIGH (ref 0.67–1.17)
GFR,Black: 75 *
GFR,Caucasian: 65 *
Glucose: 90 mg/dL (ref 60–99)
Lab: 24 mg/dL — ABNORMAL HIGH (ref 6–20)
Potassium: 4.5 mmol/L (ref 3.3–5.1)
Sodium: 139 mmol/L (ref 133–145)
Total Protein: 7 g/dL (ref 6.3–7.7)

## 2018-07-21 LAB — CBC AND DIFFERENTIAL
Baso # K/uL: 0 10*3/uL (ref 0.0–0.1)
Basophil %: 0.6 %
Eos # K/uL: 0.1 10*3/uL (ref 0.0–0.5)
Eosinophil %: 1.8 %
Hematocrit: 54 % — ABNORMAL HIGH (ref 40–51)
Hemoglobin: 17.6 g/dL — ABNORMAL HIGH (ref 13.7–17.5)
IMM Granulocytes #: 0 10*3/uL (ref 0.0–0.0)
IMM Granulocytes: 0.3 %
Lymph # K/uL: 1.9 10*3/uL (ref 1.3–3.6)
Lymphocyte %: 29.1 %
MCH: 32 pg/cell (ref 26–32)
MCHC: 32 g/dL (ref 32–37)
MCV: 98 fL — ABNORMAL HIGH (ref 79–92)
Mono # K/uL: 0.7 10*3/uL (ref 0.3–0.8)
Monocyte %: 10 %
Neut # K/uL: 3.8 10*3/uL (ref 1.8–5.4)
Nucl RBC # K/uL: 0 10*3/uL (ref 0.0–0.0)
Nucl RBC %: 0 /100 WBC (ref 0.0–0.2)
Platelets: 218 10*3/uL (ref 150–330)
RBC: 5.6 MIL/uL (ref 4.6–6.1)
RDW: 12.7 % (ref 11.6–14.4)
Seg Neut %: 58.2 %
WBC: 6.5 10*3/uL (ref 4.2–9.1)

## 2018-07-21 LAB — TSH: TSH: 3.5 u[IU]/mL (ref 0.27–4.20)

## 2018-07-21 LAB — TESTOSTERONE, FREE, TOTAL
Testosterone,% Free: 1 %
Testosterone,Free: 20 pg/mL — ABNORMAL LOW (ref 47–244)
Testosterone: 135 ng/dL — ABNORMAL LOW (ref 193–740)

## 2018-07-21 LAB — LIPID PANEL
Chol/HDL Ratio: 3.1
Cholesterol: 218 mg/dL — AB
HDL: 70 mg/dL — ABNORMAL HIGH (ref 40–60)
LDL Calculated: 125 mg/dL
Non HDL Cholesterol: 148 mg/dL
Triglycerides: 113 mg/dL

## 2018-07-21 LAB — SEX HORMONE BINDING GLOBULIN: Sex Hormone Binding Glob: 46 nmol/L (ref 10–80)

## 2018-08-05 ENCOUNTER — Ambulatory Visit
Admission: RE | Admit: 2018-08-05 | Discharge: 2018-08-05 | Disposition: A | Discharge status: Home / Self Care | Source: Ambulatory Visit | Attending: Cardiology | Admitting: Cardiology

## 2018-08-05 DIAGNOSIS — I2510 Atherosclerotic heart disease of native coronary artery without angina pectoris: Secondary | ICD-10-CM | POA: Insufficient documentation

## 2018-08-05 DIAGNOSIS — Z9189 Other specified personal risk factors, not elsewhere classified: Secondary | ICD-10-CM

## 2018-08-08 ENCOUNTER — Other Ambulatory Visit: Payer: Self-pay

## 2018-08-08 ENCOUNTER — Other Ambulatory Visit: Payer: Self-pay | Admitting: Cardiology

## 2018-08-08 DIAGNOSIS — R079 Chest pain, unspecified: Secondary | ICD-10-CM

## 2018-08-08 DIAGNOSIS — I2510 Atherosclerotic heart disease of native coronary artery without angina pectoris: Secondary | ICD-10-CM

## 2018-08-10 ENCOUNTER — Telehealth: Payer: Self-pay

## 2018-08-10 NOTE — Telephone Encounter (Signed)
Please fit in. Thanks

## 2018-08-10 NOTE — Telephone Encounter (Signed)
-----   Message from Hennie Duos, MD sent at 08/08/2018  3:49 PM EDT -----  Reviewed results of CT calcium score. Needs f/u with limited stress echo in 2 weeks

## 2018-08-11 NOTE — Telephone Encounter (Signed)
Spoke with patient. Scheduled him for SE and r/v with KR on 8/12 in [place]. DONE.

## 2018-08-20 ENCOUNTER — Ambulatory Visit: Payer: BLUE CROSS/BLUE SHIELD | Admitting: Cardiology

## 2018-08-21 ENCOUNTER — Encounter: Payer: Self-pay | Admitting: Orthopedic Surgery

## 2018-08-21 ENCOUNTER — Ambulatory Visit: Payer: BLUE CROSS/BLUE SHIELD | Admitting: Orthopedic Surgery

## 2018-08-21 VITALS — BP 138/73 | HR 83 | Ht 68.0 in | Wt 164.0 lb

## 2018-08-21 DIAGNOSIS — M8949 Other hypertrophic osteoarthropathy, multiple sites: Secondary | ICD-10-CM

## 2018-08-21 DIAGNOSIS — M1611 Unilateral primary osteoarthritis, right hip: Secondary | ICD-10-CM

## 2018-08-21 DIAGNOSIS — M159 Polyosteoarthritis, unspecified: Secondary | ICD-10-CM

## 2018-08-21 MED ORDER — MELOXICAM 7.5 MG PO TABS *I*
7.5000 mg | ORAL_TABLET | Freq: Two times a day (BID) | ORAL | 1 refills | Status: DC
Start: 2018-08-21 — End: 2019-06-23

## 2018-08-21 NOTE — Progress Notes (Signed)
Alejandro Dennis:   Curto, Alejandro Dennis  MR #:  [id number]   CSN:  [id number] DOB:  Mar 06, 1955   DICTATED BY:  Chaya Jananiel G Kleehammer, PA DATE OF VISIT:  08/21/2018     CHIEF COMPLAINT:  Right lower back and hip pain.    INTERVAL HISTORY:  Alejandro Dennis states he started with this pain approximately 7 months ago.  No injury or trauma to speak of.  He is very active in golf.  He does enjoy jogging as well.  He has done a number of half marathons throughout his life, had some plans of doing a half marathon with his daughter this fall.  Unfortunately, due to the COVID-19 pandemic this has been canceled, but he has continued to struggle with this pain.  He was seen by his primary care and another orthopedic.  He was not pleased with his visit with the orthopedic and was asking for a second opinion.  He had an x-ray and MRI of the hip, which he was told there was only some early arthritis.  He has also noticed over the past 6 months or so he has had a bulge in the quad.  He cannot remember any sort of injury or trauma.  It is not painful.  This was marked for his MRI, but the MRI did not pick up on anything conclusive and ruled out any sort of mass or lesion.  He wanted to bring this to my attention at today's visit.  He has occasionally taking some ibuprofen which does seem to help with his pain.  Most of the pain is in the lower back.  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  Any sort of rotation seems to bother him.  He is not sure if this is a hip or a back issue and was referred over to us today as a second opinion to help with his diagnostics.  He denies any abdominal pain, no pain with coughing or sneezing.  He denies any radicular pain.  No numbness or tingling down the leg.  He states he has always been stiff and has not noticed any significant decrease in range of motion throughout the hip.     Past medical, surgical, family, social history, medications,  allergies, and review of systems were reviewed with the patient today and available per new patient questionnaire.    MEDICAL HISTORY:  Carotid artery disease.    SURGICAL HISTORY:  Had surgery on his carotid artery on the right, colonoscopy and hernia repair.    MEDICATIONS:  Reviewed per electronic medical record and new patient questionnaire from 08/21/2018.    PHYSICAL EXAMINATION:  GENERAL:  Alejandro Dennis is a 63 year old gentleman, alert and oriented x3.  PSYCHIATRIC:  Pleasant affect, appears in no acute distress.  SKIN:  Cool and dry to touch no laceration or lesions.  HEENT:  He is normocephalic, atraumatic.  SPINE:  Normal range of motion throughout the spine.  Negative head compression test.  Negative Spurling's.  No upper motor neuron signs.  EXTREMITIES:  Distal pulse intact in both upper and lower extremities.  Full range of motion of both upper extremities.  Deep tendon reflexes are 2 plus. He is ambulating with a non-antalgic gait.  He has discomfort with circumduction of the lumbar spine.  He has tenderness to the right sacroiliac joint.  He can flex the hip to 120 degrees he has internal rotation 20 external rotation is  to 40.  Negative FADIR, symmetrical FABER.  Negative log roll.  Negative straight-leg raise.  He can flex, abduct, and adduct the hip against resistance.  He does have a palpable bulge, it seems to be a herniation of the quad, only noted with hip flexion.  When he is in extension, no palpable defect is noted.  Calves are soft and nontender bilaterally.  NEUROVASCULAR:  Distal neurovascular exam intact.    IMAGING:  X-rays and MRI were brought in by the patient today showing degenerative changes in the SI joint.  He does have some early degenerative changes of the femoral acetabular joint as well noted on MRI.  No significant joint space narrowing is appreciated on x-ray.  No obvious mass or lesion is appreciated throughout the quad on this MRI.  No herniation is appreciated, however I can  only appreciate this on his physical exam with any sort of activation in flexion of that quad.    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  I believe most of his pain is coming from the sacroiliac joint.  At this time, I would like to refer him to Dr. Karle Starch for consideration of sacroiliac joint injection with cortisone, possible physical therapy.  I will start him on meloxicam 7.5 mg twice a day as needed for pain.  He will be seeing his cardiologist in the next week or 2 and make him aware that he is on this non-steriodal antiinflammatory.  He will follow up with [place] on an as-needed basis.  He is not a candidate for hip arthroscopic surgery at this time, but may become a candidate for an intra-articular cortisone injection in the hip joint if he does not get full relief from the sacroiliac joint injection.  Questions were invited and answered to patient's satisfaction.  He can continue with all activity to his tolerance.  We did discuss good cross training.  If he should have any further questions or concerns, feel free to contact the office.       Dictated By:  Pryor Ochoa, PA      ______________________________  Fran Lowes, MD    DGK/MODL  DD:  08/21/2018 15:43:42  DT:  08/21/2018 18:13:06  Job #:  [id number]    cc:

## 2018-08-24 ENCOUNTER — Ambulatory Visit: Payer: BLUE CROSS/BLUE SHIELD | Admitting: Orthopedic Surgery

## 2018-09-02 ENCOUNTER — Ambulatory Visit: Payer: BLUE CROSS/BLUE SHIELD | Admitting: Cardiology

## 2018-09-02 ENCOUNTER — Ambulatory Visit
Admission: RE | Admit: 2018-09-02 | Discharge: 2018-09-02 | Disposition: A | Discharge status: Home / Self Care | Source: Ambulatory Visit | Attending: Cardiology | Admitting: Cardiology

## 2018-09-02 VITALS — BP 148/88 | HR 68 | Ht 68.0 in | Wt 164.0 lb

## 2018-09-02 DIAGNOSIS — R079 Chest pain, unspecified: Secondary | ICD-10-CM | POA: Insufficient documentation

## 2018-09-02 DIAGNOSIS — I2510 Atherosclerotic heart disease of native coronary artery without angina pectoris: Secondary | ICD-10-CM

## 2018-09-02 LAB — EXERCISE STRESS ECHO LIMITED (ISCHEMIA ONLY)
BMI: 25 kg/m2
BP Diastolic: 88 mmHg
BP Systolic: 148 mmHg
BSA: 1.89 m2
EF: 60 %
Estimated workload: 12.8 METS
Heart Rate: 73 {beats}/min
Height: 68 in
MPHR: 159 {beats}/min
Peak DBP: 92 mmHg
Peak HR: 153 {beats}/min
Peak SBP: 202 mmHg
Percent MPHR: 96.2 %
RPP: 30906 BPM x mmHG
RR Interval: 821.92 ms
Stress Peak Stage: 3.51
Stress duration (min): 10 min
Stress duration (sec): 31 s
Weight (lbs): 164 [lb_av]
Weight: 2624 oz

## 2018-09-02 MED ORDER — ROSUVASTATIN CALCIUM 10 MG PO TABS *I*
10.0000 mg | ORAL_TABLET | Freq: Every day | ORAL | 2 refills | Status: DC
Start: 2018-09-02 — End: 2019-01-25

## 2018-09-02 MED ORDER — PERFLUTREN LIPID MICROSPHERE 1.1 MG/ML (DEFINITY) IV SUSP *I*
1.3000 mL | INTRAVENOUS | Status: DC | PRN
Start: 2018-09-02 — End: 2018-09-03

## 2018-09-02 NOTE — Progress Notes (Signed)
Cardiology Office Revisit Note    Date of Visit: 09/02/2018 Patient: Alejandro Dennis   Patients PCP: Jetty PeeksPenird, Kevin D, MD Patient DOB: 06/17/1955     Subjective/Reason For Visit     I had the pleasure of seeing Alejandro Dennis in cardiology followup on 09/02/2018.  Alejandro Dennis returned today for follow-up evaluation with a stress echocardiogram given abnormal calcium scoring of the coronary arteries.  He has not been able to exercise as usual due to ongoing problems with hip arthritis.  In fact he received intra-articular steroid therapy recently.    Alejandro Dennis is a 63 year old male who has been demonstrated to have evidence of premature atherosclerotic vascular disease as required carotid endarterectomy on the right side in 2017 for severe right ICA stenosis. He had x-ray of the right hip joint and was incidentally discovered to have right femoral artery severe calcification.  He self-referred to me for further cardiac workup.  Doppler study of the lower extremity showed no evidence of flow limitation.  Alejandro Dennis otherwise is quite active and in fact runs 4-5 miles quite frequently with no ongoing symptoms of chest pain and dyspnea or palpitations or lightheadedness.  He reports that he is doing well otherwise.   He also has hyperlipidemia.      Past Medical History:   Diagnosis Date    Carotid artery stenosis, asymptomatic, right     HLD (hyperlipidemia)      Past Surgical History:   Procedure Laterality Date    Carotid endarterectomy Right 07/19/2015    INGUINAL HERNIA REPAIR Right 2010     ROS     Constitutional symptoms negative  Weight gain/loss none  GI unremarkable  Respiratory unremarkable  Cardiac as described above  Neurological unremarkable      Medications     Current Outpatient Medications   Medication Sig    sildenafil (REVATIO) 20 MG tablet TAKE 2 3 TABLETS BY MOUTH EVERY DAY AS NEEDED    meloxicam (MOBIC) 7.5 MG tablet Take 1 tablet (7.5 mg total) by mouth 2 times daily    testosterone cypionate  (DEPO-TESTOSTERONE) 200 MG/ML injection INJECT 400MG  (2ML) INTRAMUSCULAR EVERY 2.5 WEEKS (CODE F) MDD 400/2.5W    aspirin 81 MG EC tablet Take 81 mg by mouth every morning       ibuprofen (ADVIL,MOTRIN) 200 MG tablet Take 600 mg by mouth every 6 hours as needed   Takes 3 pills at a time     atorvastatin (LIPITOR) 10 MG tablet Take 10 mg by mouth every other day         Vitals and Physical Exam     Bijon's  height is 1.727 m (5\' 8" ) and weight is 74.4 kg (164 lb). His blood pressure is 148/88 and his pulse is 68.  Body mass index is 24.94 kg/m.    Physical Exam   Healthy appearing male wearing a mask.  Repeat blood pressure 130/80  Constitutional: Patient is oriented to person, place, and time. Appears well-developed and well-nourished.   HENT:   Head: Normocephalic.   Eyes: Conjunctivae are normal. No scleral icterus.   Neck: Neck supple. No JVD present. No thyromegaly present.   Carotid endarterectomy scar on right side  Cardiovascular: Normal rate, regular rhythm, normal heart sounds and intact distal pulses.  Exam reveals no gallop.    No murmur heard.  Pulmonary/Chest: Effort normal and breath sounds normal with no wheezing or rales.    Abdominal: Soft with no mass.   Musculoskeletal: Normal range of motion with  no edema.   Neurological: alert and oriented to person, place, and time.   Skin: Skin is warm.   Psychiatric: With normal mood and affect.   Vitals reviewed.      Laboratory Data     Hematology:   Results in Past 730 Days  Result Component Current Result Previous Result   WBC 6.5 (07/21/2018) Not in Time Range   Hemoglobin 17.6 (H) (07/21/2018) Not in Time Range   Hematocrit 54 (H) (07/21/2018) Not in Time Range   Platelets 218 (07/21/2018) Not in Time Range     Chemistry:   Results in Past 730 Days  Result Component Current Result Previous Result   Sodium 139 (07/21/2018) 140 (11/19/2017)   Potassium 4.5 (07/21/2018) 4.6 (11/19/2017)   Creatinine 1.19 (H) (07/21/2018) 1.23 (H) (11/19/2017)   Glucose 90  (07/21/2018) 95 (11/19/2017)   Calcium 9.0 (07/21/2018) 9.5 (11/19/2017)   AST 36 (07/21/2018) 43 (11/19/2017)   ALT 47 (07/21/2018) 60 (H) (11/19/2017)   TSH 3.50 (07/21/2018) Not in Time Range     Coagulation Studies:   No results found for requested labs within last 730 days.     Cardiac:   No results found for requested labs within last 730 days.     Lipids:   Results in Past 730 Days  Result Component Current Result Previous Result   Cholesterol 218 (!) (07/21/2018) 223 (!) (11/19/2017)   HDL 70 (H) (07/21/2018) 78 (H) (11/19/2017)   Triglycerides 113 (07/21/2018) 142 (11/19/2017)   LDL Calculated 125 (07/21/2018) 117 (11/19/2017)   Chol/HDL Ratio 3.1 (07/21/2018) 2.9 (11/19/2017)     Cardiac/Imaging Data & Risk Scores     ECG:  Stress echo showed normal resting LVEF and no ischemia at a good cardiac workload.       Exercise Stress Echo Complete 06/29/2015    Narrative  The left ventricular ejection fraction is normal.   The echo response is negative for ischemia.   The patient's functional capacity is excellent.   The blood pressure response to exercise was hypertensive.   There is mild aortic regurgitation.                         CV US Doppler Arteries Lower Extremity Bilateral 07/13/2018    Narrative History: xray shows calcifications in arterial system, right hip/groin   pain.  Prior: na    1. Bilateral lower arterial system appears patent without evidence of   stenosis.    2. Triphasic waveforms noted in the bilateral common iliac artery,   external iliac artery, common femoral artery, superficial femoral artery,   profunda artery , popliteal artery, posterior tibial artery and peroneal   artery.    *Description of procedure: sonographic evaluation of the arterial system   was performed using B-mode, color and spectral doppler imaging.            Impression and Plan     Patient Active Problem List   Diagnosis Code    Cervical radiculopathy M54.12    L ICA stenosis s/p L CEA 6/28 I65.21    Bilateral carotid  artery disease I77.9       This is an 63 y.o. 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  He has hyperlipidemia as risk factor.  He is status post carotid endarterectomy.  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    He remains asymptomatic from a cardiovascular point of view.    Coronary atherosclerosis  Reviewed the implications of calcium scoring of the coronary arteries being mildly abnormal.  It signifies coronary atherosclerosis but the lesions are not site-specific and did not have any correlation with degree of narrowing.  I reviewed the results of the stress echo and reassured that there is no evidence of flow limitation involving coronary arteries with no evidence of ischemia.    Would continue aggressive coronary risk factor modification.  Since atorvastatin failed to prevent coronary and carotid vessel disease I switched him over to Crestor 10 mg daily.  We need to ensure that LDL has improved below 70 range.  I have ordered repeat fasting lipid profile in 4 weeks.      We need to ensure systolic blood pressure stays below 1:30 and diastolic below 85.    He is on low-dose aspirin therapy and this will be continued because of documented carotid vessel disease severe requiring endarterectomy.    Hopefully he'll be able to resume his exercise program.    He was counseled on low fat and low calorie diet.    I will see him in follow-up in one year.    Thank you for allowing me to participate in the care of this pleasant patient.           Jess BartersKRISHNA M Lerone Onder, MD  Electronically signed on 09/02/2018 at 8:26 AM.

## 2018-09-10 ENCOUNTER — Ambulatory Visit: Payer: BLUE CROSS/BLUE SHIELD | Admitting: Physical Medicine and Rehabilitation

## 2018-09-10 ENCOUNTER — Encounter: Payer: Self-pay | Admitting: Physical Medicine and Rehabilitation

## 2018-09-10 VITALS — BP 144/91 | HR 98 | Ht 68.0 in | Wt 162.0 lb

## 2018-09-10 DIAGNOSIS — M25551 Pain in right hip: Secondary | ICD-10-CM

## 2018-09-10 DIAGNOSIS — M533 Sacrococcygeal disorders, not elsewhere classified: Secondary | ICD-10-CM

## 2018-09-10 NOTE — Progress Notes (Signed)
Dear Dr. Parthenia AmesPenird, Kaylyn LayerKevin D, MD,     Thelma BargeDavid Filippone [facility]10/2018 [facility]    Chief complaint: Right back and groin pain    History of present illness:Alejandro Dennis is a 63 y.o. male with complaints of right back and groin pain.  The patient points to his right sacral sulcus and along his lateral hip and into his groin.  He had an episode in the past where he was startled by a loud noise when he was ion vacation.  He had an acute episode at that time by jumping out of bed due to this noise of severe back pain.  Gradually this slowly decreased.  He is always described having "tight" hips.  He points to the right sacral sulcus and lateral hip and he also has a abnormality of the right lateral quadricep.  An MRI is available for this.  At issue is he is an avid golfer, right-handed, and his back and his leg is been limiting for him as far as golf and more significantly for him is his running.  He is here today to determine how best to proceed.  His joint space actually is reasonably intact on x-ray and no need for a hip surgery was identified.    Prior treatment: Meloxicam    Past Medical, Surgical History and Review of Systems were reviewed and annotated as appropriate with the electronic medical chart.     Physical Examination:  General:  Pleasant, straightforward, healthy appearing individual.  Mental Status:  Oriented to person, place, and time. Displays appropriate mood and affect.  Gait:  Gait is normal.  Strength Testing:  Bilateral lower extremity strength is normal and symmetric. No atrophy or tone abnormalities noted.  Sensation:  Normal, No loss of sensation in the lower extremities or trunk.  Neuro:  Bilateral lower extremity coordination and reflexes are physiologic and symmetric.  Spine ROM: Increased pain with spinal extension and rotation to the right more than flexion  Peripheral Joint ROM: The patient has right greater than left hip limitations on hip  flexion and internal greater than external rotation.  Provocative Maneuvers: Hip and SI provocative maneuvers on the right produce both groin and lateral hip pain as well as back pain  Straight Leg Raising and/or Foramen Compression Maneuvers:  Straight leg raising in the seated and supine positions are negative for radicular pain.  Palpation:  Inspection and palpatory examination of the Lumbar spine and lower extremities is remarkable for tenderness of the right sacral sulcus and lateral quadricep with a prominent muscular mass in that area.. No lymphadenopathy is noted.  Peripheral Vascular:  Pedal pulses are intact. No obvious extremity swelling in the lower extremities.  Appearance of Skin:  Skin inspection of the trunk and lower extremities is unremarkable for this issue.  Special Testing:  None.    Diagnostic testing and results: Plain films of the lumbar spine and an MRI of the lumbar spine from 2015 are available for review from [facility] demonstrating relatively preserved disc space height.  X-rays of the hip have good SI joint views that demonstrate intact joint space.    Impression:  Right lower back pain potentially SI mediated  Right greater than left hip range of motion loss with right groin pain    Plan:  I reviewed the options with the patient in detail.  I have offered the patient a right SI joint therapeutic/diagnostic injection to see if this  will be helpful.  If not we will revisit physical therapy for both his hip and his back and consideration of more definitive imaging.  In regards to the hip issue I understand it is not related to a problem that will ultimately require hip replacement but he has significant limitations and pain with hip provocative maneuvers and I will have him see Dr. Caroleen Hamman to determine if there are other options that might be available for his hip issues as well as further evaluation of the soft tissue mass in his right quadricep.    Medications and Orders  Placed This Visit:  1. Right hip pain  - AMB REFERRAL TO PHYSICAL MEDICINE AND REHABILITATION    2. Sacroiliac joint dysfunction  - PMR Spine Injections    No follow-ups on file.        Thank you for allowing [place] to participate in the care of this patient. Please do not hesitate to call us with any questions.    Briant Sites, MD 09/10/2018 4:05 PM

## 2018-09-21 ENCOUNTER — Telehealth: Payer: Self-pay | Admitting: Physical Medicine and Rehabilitation

## 2018-09-21 NOTE — Telephone Encounter (Signed)
The requested right SI joint therapeutic injection is performed under x-ray guidance.  All procedures at the [facility] are only with x-ray/live fluoroscopy guidance.  I hope this assists in the authorization to proceed with the procedure.

## 2018-10-01 ENCOUNTER — Telehealth: Payer: Self-pay

## 2018-10-01 NOTE — Telephone Encounter (Signed)
-----   Message from Drema Dallas sent at 10/01/2018  7:09 AM EDT -----    ----- Message -----  From: Tad Moore  Sent: 09/30/2018   3:27 PM EDT  To: [facility] Referrals    Patient calling, states his CT was denied and they are requesting a peer to peer.    Reference Number: [id number]  Phone Number:  [phone]    Patient Phone Number: [phone]

## 2018-10-01 NOTE — Telephone Encounter (Signed)
Error

## 2018-10-01 NOTE — Telephone Encounter (Signed)
-----   Message from Hennie Duos, MD sent at 10/01/2018  3:33 PM EDT -----  I will call and see if I could convince them.  ----- Message -----  From: Drema Dallas  Sent: 10/01/2018   2:45 PM EDT  To: Hennie Duos, MD    Pt has received a bill for the CT Heart Calcium Score and is wondering if you would do a Peer to Peer to attempt to get coverage.  I spoke with the patient and explained that the test is not a covered benefit under any insurance but I would ask if you would make the phone call. Please advise and I will call the patient and let him know.  Thank you.  [place]    Reference Number: [id number]  Phone Number:  [phone]    Drema Dallas  [facility] Referral Coordinator  [facility]  [address]  [place] [address]  [phone]     ----- Message -----  From: Drema Dallas  Sent: 10/01/2018   7:09 AM EDT  To: [facility] Referrals      ----- Message -----  From: Tad Moore  Sent: 09/30/2018   3:27 PM EDT  To: [facility] Referrals    Patient calling, states his CT was denied and they are requesting a peer to peer.    Reference Number: [id number]  Phone Number:  [phone]    Patient Phone Number: [phone]

## 2018-10-01 NOTE — Telephone Encounter (Addendum)
S/w pt and explained that CT Heart Calcium Score is not a covered benefit.  I told patient I would forward the information to Dr Janese Banks for a Peer to Peer and I will call patient and advise him of outcome when I hear back from Dr. Janese Banks.  Reference Number: [id number]   Phone Number: [phone] [facility]

## 2018-10-06 NOTE — Telephone Encounter (Signed)
S/w pt and explained that CT Heart Calcium Score is not a covered benefit.  Pt states that he may have looked at the bill and thought it was for just the CT Heart Calcium Score but may have been other testing and appointments as well.  He is going to check it and will call if he needs any additional assistance.  Done.

## 2018-10-09 ENCOUNTER — Ambulatory Visit: Payer: BLUE CROSS/BLUE SHIELD | Admitting: Physical Medicine and Rehabilitation

## 2018-10-09 ENCOUNTER — Encounter: Payer: Self-pay | Admitting: Physical Medicine and Rehabilitation

## 2018-10-09 VITALS — BP 177/92 | Ht 68.0 in | Wt 162.0 lb

## 2018-10-09 DIAGNOSIS — M533 Sacrococcygeal disorders, not elsewhere classified: Secondary | ICD-10-CM

## 2018-10-09 DIAGNOSIS — M25551 Pain in right hip: Secondary | ICD-10-CM

## 2018-10-09 NOTE — Progress Notes (Signed)
Chief complaint: Follow-up right hip/possible SI joint mediated pain    HPI: Patient returns to the office at my request.  Dr. Karle Starch was seeking authorization for therapeutic right SI joint injection however this was declined.  We needed documentation of positive provocative SI joint maneuvers.    There was a notation made of the positive maneuvers but no delineation as to what these maneuvers were.    Physical exam:    Palpation over the right sacral sulcus was pain provoking.  Range of motion of the right hip was a little restricted and pain provoking.    SIJ provocative tests:    (+)FABER caused right inguinal pain is well    (+) Compression    (+) Gaenslen's caused right hip pain as well    Impression:    Right low back pain possibly right SI joint mediated    Right hip pain    Plan:    We will resubmit for authorization for therapeutic right SI joint injection under fluoroscopic guidance.    Patient will follow-up with Dr. Briant Sites postinjection.    Please feel free to contact me with any questions or concerns that you may have.    Darrick Grinder, [place]

## 2018-10-26 ENCOUNTER — Other Ambulatory Visit
Admission: RE | Admit: 2018-10-26 | Discharge: 2018-10-26 | Disposition: A | Discharge status: Home / Self Care | Source: Ambulatory Visit | Attending: General Practice | Admitting: General Practice

## 2018-10-26 ENCOUNTER — Telehealth: Payer: Self-pay

## 2018-10-26 DIAGNOSIS — R030 Elevated blood-pressure reading, without diagnosis of hypertension: Secondary | ICD-10-CM | POA: Insufficient documentation

## 2018-10-26 LAB — BASIC METABOLIC PANEL
Anion Gap: 10 (ref 7–16)
CO2: 27 mmol/L (ref 20–28)
Calcium: 9.7 mg/dL (ref 8.6–10.2)
Chloride: 101 mmol/L (ref 96–108)
Creatinine: 1.2 mg/dL — ABNORMAL HIGH (ref 0.67–1.17)
GFR,Black: 75 *
GFR,Caucasian: 64 *
Glucose: 92 mg/dL (ref 60–99)
Lab: 29 mg/dL — ABNORMAL HIGH (ref 6–20)
Potassium: 5.8 mmol/L — ABNORMAL HIGH (ref 3.3–5.1)
Sodium: 138 mmol/L (ref 133–145)

## 2018-10-26 NOTE — Telephone Encounter (Signed)
FYI only,    S/W patient to inform per KR instructions below. Patient agrees to instructions. He admits to having severe shoulder pain that he has been taking Ibuprofen for. Informed to make sure he get the pain under control as pain can elevate the BP as well. Patient verbalizes understanding. He will start lisinopril 10 mg daily and monitor BP for now.

## 2018-10-26 NOTE — Telephone Encounter (Signed)
Patient returned call please call again

## 2018-10-26 NOTE — Telephone Encounter (Signed)
could start  lisinopril and monitor blood pressure.  Call back systolic stays above 277 and diastolic above 85.

## 2018-10-26 NOTE — Telephone Encounter (Signed)
Left message to call back  

## 2018-10-26 NOTE — Telephone Encounter (Signed)
Name of caller:patient  Relationship to patient:  Phone number:[phone]    Reason for call:per action telephone patient experienced severely elevated BP over the weekend ,saw PCP this morning and would like to go over what they want him to do.

## 2018-10-26 NOTE — Telephone Encounter (Signed)
Spoke with patient regarding hypertension that has been ongoing for about one month. Pt also stated that shoulder pain started about one month ago as well, and has been taking ibuprofen for that. Recent readings over the weekend are:159/97, 161/102, Denies dizziness, but states he has been "blinking more and having a mild headache". Also stated he played 18 holes of golf yesterday and felt great. Pt saw PCP this am who started him on Lisinopril 10mg  daily, but would like to confirm with Janese Banks first before taking. Please review and advise.

## 2018-10-28 ENCOUNTER — Other Ambulatory Visit: Payer: Self-pay | Admitting: Gastroenterology

## 2018-10-28 ENCOUNTER — Telehealth: Payer: Self-pay | Admitting: Cardiology

## 2018-10-28 ENCOUNTER — Encounter: Payer: Self-pay | Admitting: Cardiology

## 2018-10-28 NOTE — Telephone Encounter (Signed)
Advised pt. Pt verbalized understanding.

## 2018-10-28 NOTE — Telephone Encounter (Signed)
That case we need to repeat BMP and decide on lisinopril therapy.  Most likely he had artifactual hyperkalemia

## 2018-10-28 NOTE — Telephone Encounter (Signed)
Name of caller: Patient  Relationship to patient:  Phone number:[phone]    Reason for call: Patient states He was told to stop taking Lisinopril as his potassium was 5.8 and elevated. Please advise KR and BP was 112/72 today .

## 2018-10-28 NOTE — Telephone Encounter (Signed)
In that case potassium of 5.8 would be artifactual since it was checked before lisinopril was started.  I would get repeat potassium checked this week either tomorrow or Friday and then we can decide on resuming lisinopril.

## 2018-10-28 NOTE — Telephone Encounter (Signed)
Will hold lisinopril  Monitor blood pressure.  If systolic goes beyond 859 would consider adding metoprolol succinate 25 mg daily.

## 2018-10-28 NOTE — Telephone Encounter (Signed)
See TE.

## 2018-10-28 NOTE — Telephone Encounter (Signed)
Name of caller:  Patient   Relationship to patient:  Self   Phone number:  [phone]     Reason for call: Patient calling back. Patient says he was instructed to get off on his current medication as prescribed by his PCP for high BP because his potassium level when he had his blood drawn was elevated, said he left the office from getting that Rx and had his blood drawn.  Has not had blood drawn since.  Drug couldn't have had an effect on his potassium level because he hadn't started taking it yet.

## 2018-10-28 NOTE — Telephone Encounter (Signed)
LM

## 2018-10-29 ENCOUNTER — Other Ambulatory Visit: Payer: Self-pay

## 2018-10-29 ENCOUNTER — Other Ambulatory Visit: Payer: Self-pay | Admitting: Gastroenterology

## 2018-10-29 DIAGNOSIS — I10 Essential (primary) hypertension: Secondary | ICD-10-CM

## 2018-10-29 NOTE — Telephone Encounter (Signed)
ok 

## 2018-10-29 NOTE — Telephone Encounter (Signed)
Discussed plan with pt, he will be getting lab worked checked again next week. Plans to remain OFF the lisinopril until those results are reviewed. FYI

## 2018-10-30 ENCOUNTER — Other Ambulatory Visit: Payer: Self-pay

## 2018-10-30 ENCOUNTER — Other Ambulatory Visit
Admission: RE | Admit: 2018-10-30 | Discharge: 2018-10-30 | Disposition: A | Discharge status: Home / Self Care | Source: Ambulatory Visit | Attending: Pediatrics | Admitting: Pediatrics

## 2018-10-30 ENCOUNTER — Telehealth: Payer: Self-pay | Admitting: Cardiology

## 2018-10-30 ENCOUNTER — Other Ambulatory Visit
Admission: RE | Admit: 2018-10-30 | Discharge: 2018-10-30 | Disposition: A | Discharge status: Home / Self Care | Source: Ambulatory Visit | Attending: Cardiology | Admitting: Cardiology

## 2018-10-30 DIAGNOSIS — R030 Elevated blood-pressure reading, without diagnosis of hypertension: Secondary | ICD-10-CM | POA: Insufficient documentation

## 2018-10-30 DIAGNOSIS — I10 Essential (primary) hypertension: Secondary | ICD-10-CM

## 2018-10-30 DIAGNOSIS — E875 Hyperkalemia: Secondary | ICD-10-CM | POA: Insufficient documentation

## 2018-10-30 DIAGNOSIS — I2510 Atherosclerotic heart disease of native coronary artery without angina pectoris: Secondary | ICD-10-CM

## 2018-10-30 LAB — LIPID PANEL
Chol/HDL Ratio: 2.5
Cholesterol: 179 mg/dL
HDL: 72 mg/dL — ABNORMAL HIGH (ref 40–60)
LDL Calculated: 94 mg/dL
Non HDL Cholesterol: 107 mg/dL
Triglycerides: 66 mg/dL

## 2018-10-30 LAB — COMPREHENSIVE METABOLIC PANEL
ALT: 44 U/L (ref 0–50)
AST: 33 U/L (ref 0–50)
Albumin: 4.7 g/dL (ref 3.5–5.2)
Alk Phos: 31 U/L — ABNORMAL LOW (ref 40–130)
Anion Gap: 10 (ref 7–16)
Bilirubin,Total: 1.2 mg/dL (ref 0.0–1.2)
CO2: 29 mmol/L — ABNORMAL HIGH (ref 20–28)
Calcium: 9.5 mg/dL (ref 8.6–10.2)
Chloride: 100 mmol/L (ref 96–108)
Creatinine: 1.18 mg/dL — ABNORMAL HIGH (ref 0.67–1.17)
GFR,Black: 76 *
GFR,Caucasian: 66 *
Glucose: 101 mg/dL — ABNORMAL HIGH (ref 60–99)
Lab: 22 mg/dL — ABNORMAL HIGH (ref 6–20)
Potassium: 4.3 mmol/L (ref 3.3–5.1)
Sodium: 139 mmol/L (ref 133–145)
Total Protein: 7.2 g/dL (ref 6.3–7.7)

## 2018-10-30 LAB — BASIC METABOLIC PANEL
Anion Gap: 11 (ref 7–16)
CO2: 28 mmol/L (ref 20–28)
Calcium: 9.5 mg/dL (ref 8.6–10.2)
Chloride: 100 mmol/L (ref 96–108)
Creatinine: 1.15 mg/dL (ref 0.67–1.17)
GFR,Black: 78 *
GFR,Caucasian: 68 *
Glucose: 101 mg/dL — ABNORMAL HIGH (ref 60–99)
Lab: 22 mg/dL — ABNORMAL HIGH (ref 6–20)
Potassium: 4.3 mmol/L (ref 3.3–5.1)
Sodium: 139 mmol/L (ref 133–145)

## 2018-10-30 MED ORDER — AMLODIPINE BESYLATE 5 MG PO TABS *I*
5.0000 mg | ORAL_TABLET | Freq: Every day | ORAL | 3 refills | Status: DC
Start: 2018-10-30 — End: 2019-10-15

## 2018-10-30 NOTE — Telephone Encounter (Signed)
Name of caller:  Patient   Relationship to patient:  Self   Phone number:  [phone]     Reason for call:  Dr. Janese Banks told patient to call in if his BP started to go up, he says his BP has been above the levels that he had told him.  Says he was on lisinopril, Dr. Janese Banks had stopped it.  He only took 2 of the pills at the beginning of the week because he had a blood test and his potassium was up.  Dr. Janese Banks, had said he would possibly start him on a different medicine.

## 2018-10-30 NOTE — Telephone Encounter (Signed)
Advised patient. He verbalizes understanding. He already had his blood drawn. Will send e-script for amlodipine in separate encounter.

## 2018-10-30 NOTE — Telephone Encounter (Signed)
Spoke to patient. Since stopping lisinopril his BP has been running 140s-150s/90s. He feels restless and has a headache. His PCP told him to get repeat labs Monday. I advised him to go today to recheck potassium, which he will do. Asking if he should be prescribed something different for his BP or wait until potassium level comes back. Please advise.

## 2018-10-30 NOTE — Telephone Encounter (Signed)
Thanks

## 2018-10-30 NOTE — Telephone Encounter (Signed)
Please have him get his labs checked today and start amlodipine 5 mg daily    Thanks

## 2018-11-02 ENCOUNTER — Telehealth: Payer: Self-pay

## 2018-11-02 ENCOUNTER — Other Ambulatory Visit: Payer: Self-pay | Admitting: Vascular Surgery

## 2018-11-02 DIAGNOSIS — I6523 Occlusion and stenosis of bilateral carotid arteries: Secondary | ICD-10-CM

## 2018-11-02 NOTE — Telephone Encounter (Signed)
-----   Message from Hennie Duos, MD sent at 10/30/2018  5:34 PM EDT -----  Please call with stable lab work.  Potassium is normal and lipid profile has nightly improved.

## 2018-11-02 NOTE — Telephone Encounter (Signed)
S/w pt - gave him message from Dr Janese Banks about recent lab work results. He understands.       Pt has concerns about his BP & new medication ( Amlodipine 5 mg  ) He wants to get off this medication - he hates taking medication. He is unsure what is making his BP go higher lately.. BP readings was last few days 147/97,124/84,134/77 ,126/84 & 120 /90.   He is not currently exercising . Explained to pt that he has known ASCVD  ( carotid endarectomy ) - basically Dr Janese Banks is treating risk factors to prevent further disease to other vessels . He will most likely be on HTN / Chol meds for life.  He is going to send you a My Chart message when the system is back up. FYI

## 2018-11-02 NOTE — Telephone Encounter (Signed)
Yes he needs to take blood pressure and cholesterol medications at this time.  I will wait for his BP readings.

## 2018-11-02 NOTE — Telephone Encounter (Signed)
S/W patient and  Informed per KR message below. Patient verbalizes understanding.

## 2018-11-08 ENCOUNTER — Emergency Department: Admitting: Radiology

## 2018-11-08 ENCOUNTER — Encounter: Admitting: Orthopedic Surgery

## 2018-11-08 ENCOUNTER — Emergency Department
Admission: EM | Admit: 2018-11-08 | Discharge: 2018-11-08 | Disposition: A | Discharge status: Home / Self Care | Source: Ambulatory Visit | Attending: Emergency Medicine | Admitting: Emergency Medicine

## 2018-11-08 ENCOUNTER — Emergency Department

## 2018-11-08 DIAGNOSIS — Y998 Other external cause status: Secondary | ICD-10-CM | POA: Insufficient documentation

## 2018-11-08 DIAGNOSIS — S63125A Dislocation of unspecified interphalangeal joint of left thumb, initial encounter: Secondary | ICD-10-CM | POA: Insufficient documentation

## 2018-11-08 DIAGNOSIS — Y9389 Activity, other specified: Secondary | ICD-10-CM | POA: Insufficient documentation

## 2018-11-08 DIAGNOSIS — M25442 Effusion, left hand: Secondary | ICD-10-CM

## 2018-11-08 DIAGNOSIS — S63115A Dislocation of metacarpophalangeal joint of left thumb, initial encounter: Secondary | ICD-10-CM

## 2018-11-08 DIAGNOSIS — S63105A Unspecified dislocation of left thumb, initial encounter: Secondary | ICD-10-CM

## 2018-11-08 DIAGNOSIS — W19XXXA Unspecified fall, initial encounter: Secondary | ICD-10-CM

## 2018-11-08 DIAGNOSIS — Y9289 Other specified places as the place of occurrence of the external cause: Secondary | ICD-10-CM | POA: Insufficient documentation

## 2018-11-08 DIAGNOSIS — S62512A Displaced fracture of proximal phalanx of left thumb, initial encounter for closed fracture: Secondary | ICD-10-CM

## 2018-11-08 DIAGNOSIS — R4689 Other symptoms and signs involving appearance and behavior: Secondary | ICD-10-CM

## 2018-11-08 DIAGNOSIS — W01198A Fall on same level from slipping, tripping and stumbling with subsequent striking against other object, initial encounter: Secondary | ICD-10-CM | POA: Insufficient documentation

## 2018-11-08 LAB — HM HIV SCREENING OFFERED

## 2018-11-08 MED ORDER — PROPOFOL 10 MG/ML IV EMUL (INTERMITTENT DOSING) WRAPPED *I*
INTRAVENOUS | Status: AC | PRN
Start: 2018-11-08 — End: 2018-11-08
  Administered 2018-11-08 (×3): 10 mg via INTRAVENOUS
  Administered 2018-11-08: 40 mg via INTRAVENOUS
  Administered 2018-11-08 (×3): 10 mg via INTRAVENOUS
  Administered 2018-11-08: 20 mg via INTRAVENOUS

## 2018-11-08 MED ORDER — PROPOFOL 10 MG/ML IV EMUL (INTERMITTENT DOSING) WRAPPED *I*
1.0000 mg/kg | Freq: Once | INTRAVENOUS | Status: AC
Start: 2018-11-08 — End: 2018-11-08
  Administered 2018-11-08: 73.5 mg via INTRAVENOUS
  Filled 2018-11-08: qty 20

## 2018-11-08 MED ORDER — MEPIVACAINE HCL 1 % IJ SOLN *I*
100.0000 mg | Freq: Once | INTRAMUSCULAR | Status: AC
Start: 2018-11-08 — End: 2018-11-08
  Administered 2018-11-08: 100 mg via DENTAL
  Filled 2018-11-08: qty 10

## 2018-11-08 NOTE — ED Procedure Documentation (Addendum)
Procedures   Procedural sedation  Performed by: Seward Grater, MD  Authorized by: Seward Grater, MD     Consent:     Consent obtained:  Written    Consent given by:  Patient    Risks discussed:  Prolonged hypoxia resulting in organ damage, prolonged sedation necessitating reversal and respiratory compromise necessitating ventilatory assistance and intubation  Universal protocol:     Procedure explained and questions answered to patient or proxy's satisfaction: yes      Imaging studies available: yes      Site/side marked: yes      Immediately prior to procedure a time out was called: yes      Patient identity confirmation method:  Verbally with patient  Indications:       Procedure performed:  Dislocation reduction    Procedural sedation needed because patient is unable to tolerate procedure with local anesthesia alone, due to movement interfering with a successful and safe completion of the procedure    Procedure necessitating sedation performed by:  Physician performing sedation    Intended level of sedation:  Deep  Pre-sedation assessment:     Time since last food or drink:  3 hours    ASA classification: class 2 - patient with mild systemic disease      Neck mobility: normal      Mallampati score:  I - soft palate, uvula, fauces, pillars visible    Pre-sedation assessments completed and reviewed: airway patency, cardiovascular function, mental status, nausea/vomiting, pain level and respiratory function      History of difficult intubation: no    Immediate pre-procedure details:     Reassessment: Patient reassessed immediately prior to procedure      Reviewed: vital signs, relevant labs/tests and NPO status      Verified: bag valve mask available, emergency equipment available, intubation equipment available, IV patency confirmed, oxygen available, reversal medications available and suction available    Procedure details (see MAR for exact dosages):     Preoxygenation:  Room air    Pre-medication:  None     Sedation:  Propofol    Analgesia:  None    Intra-procedure monitoring:  Blood pressure monitoring, cardiac monitor, continuous capnometry, continuous pulse oximetry, frequent LOC assessments and frequent vital sign checks    Intra-procedure events: none      Intra-procedure management:  Supplemental oxygen    Reversal agents:  None    Deep:  1-14 minutes  Post-procedure details:     Recovery: Patient returned to pre-procedure baseline      Estimated blood loss (see I/O flowsheets): no      Complications:  None    Patient is stable for discharge or admission: yes      Patient tolerance:  Tolerated well, no immediate complications        Seward Grater, MD     Seward Grater, MD  11/08/18 2106       Seward Grater, MD  11/08/18 2107

## 2018-11-08 NOTE — ED Triage Notes (Signed)
Pt comes into the ED today by car from home for a left hand injury.  Pt reports that he slipped and slammed his hand into the door causing the injury.  Pt states that his tetanus is current       Triage Note   Alejandro Baron, RN

## 2018-11-08 NOTE — Provider Consult (Addendum)
Patient's Name: Alejandro Dennis     Date: 11/08/2018   Time: 11:42 AM       CC: left thumb PIP dislocation  Reason for consultation:  ED consult from Dr. Johny Blamer for Left, nondominant thumb PIP dislocation with question of fracture of P1    HPI:  Alejandro Dennis is left hand dominant and 63 years old.  He missed a step, slipping on carpet and the left hand went into the door jamb.  Unsure as to how it hit, but immediate pain.  Difficulty sleeping last night.  Sought treatment today in the ED.  Xrays demonstrated dislocation.  No numbness or tingling on exam.  No other injuries.    Hand Injury  This is a new problem. The current episode started yesterday. The problem occurs constantly. The problem has been unchanged. Associated symptoms include joint swelling. Pertinent negatives include no abdominal pain, anorexia, arthralgias, change in bowel habit, chest pain, chills, congestion, coughing, diaphoresis, fatigue, fever, headaches, myalgias, nausea, neck pain, numbness, rash, sore throat, swollen glands, urinary symptoms, vertigo, visual change, vomiting or weakness. Nothing aggravates the symptoms. He has tried nothing for the symptoms.     Past Medical History:   Diagnosis Date    Carotid artery stenosis, asymptomatic, right     HLD (hyperlipidemia)      Past Surgical History:   Procedure Laterality Date    Carotid endarterectomy Right 07/19/2015    INGUINAL HERNIA REPAIR Right 2010     Allergies   Allergen Reactions    Ampicillin Rash    No Known Latex Allergy      (Not in a hospital admission)    No current facility-administered medications for this encounter.      Current Outpatient Medications   Medication    amLODIPine (NORVASC) 5 MG tablet    meloxicam (MOBIC) 15 MG tablet    sildenafil (REVATIO) 20 MG tablet    rosuvastatin (CRESTOR) 10 MG tablet    meloxicam (MOBIC) 7.5 MG tablet    testosterone cypionate (DEPO-TESTOSTERONE) 200 MG/ML injection    aspirin 81 MG EC tablet    ibuprofen (ADVIL,MOTRIN) 200  MG tablet     Social History     Occupational History    Not on file   Tobacco Use    Smoking status: Never Smoker    Smokeless tobacco: Never Used   Substance and Sexual Activity    Alcohol use: Yes     Alcohol/week: 5.0 standard drinks     Types: 5 Standard drinks or equivalent per week    Drug use: No    Sexual activity: Not on file       Review of Systems   Constitutional: Negative for chills, diaphoresis, fatigue and fever.   HENT: Negative for congestion and sore throat.    Respiratory: Negative for cough.    Cardiovascular: Negative for chest pain.   Gastrointestinal: Negative for abdominal pain, anorexia, change in bowel habit, nausea and vomiting.   Musculoskeletal: Positive for joint swelling. Negative for arthralgias, myalgias and neck pain.   Skin: Negative for rash.   Neurological: Negative for vertigo, weakness, numbness and headaches.   All other systems reviewed and are negative.      BP 136/80    Pulse 84    Temp 36.2 C (97.2 F) (Temporal)    Resp (!) 35    Ht 1.727 m (5\' 8" )    Wt 73.5 kg (162 lb)    SpO2 98%    BMI 24.63  kg/m       PHYSICAL EXAMINATION   General: NAD, NCAT, supine, alert and oriented x 4  Chest: Regular rate and rhythm.  No peripheral edema of the lower extremities.   Lungs: Respirations are unlabored on room air.  Standard upper extremity exam   RUE exam  Skin intact, no abrasions/lacerations, no open wounds, no deformities, no swelling, no ecchymosis, no fracture blisters  Non-tender, full active range of motion of shoulder/elbow/wrist/fingers  Motor: Deltoid contraction intact (5/5)            Sensation intact to light touch over deltoid                       Biceps contraction intact (5/5)            Able to abduct, adduct, flex, extend all digits, give thumbs up and O.K. sign    Sensation intact to light touch at 1st dorsal webspace, palmar aspect of index and long fingers, and lateral aspect of small finger tips            Radial pulse 2+, Capillary refill <2  seconds          LUE exam  Left thumb with superficial abrasion of the palmar aspect of the PIP joint.  Swelling of the thumb and mildly in the hand.  Wedding ring on the ring finger that is tight.  Palpable deformity of the PIP joint with dorsally displaced digit.  Pain at the thumb. Motion not tested.  Motor: Deltoid contraction intact (5/5)            Sensation intact to light touch over deltoid                       Biceps contraction intact (5/5)            Able to abduct, adduct, flex, extend all digits    Sensation intact to light touch at 1st dorsal webspace, palmar aspect of index and long fingers, and lateral aspect of small finger tips            Radial pulse 2+, Capillary refill <2 seconds             Dorsal and volar forearm compartments soft, supple, no pain with passive motion    Post reduction exam of the thumb demonstrates stable medial and lateral collateral ligaments to stress.    Imaging: X-ray of the left hand demonstrates a dorsal dislocation of the thumb at the PIP joint.  Question of volar plate fracture of the P1 vs sesamoid.  Post reduction lateral of the thumb demonstrates concentric reduction without evidence of intraarticular fractures.  Volar sesamoid visualized.      Post reduction/splinting xrays of the thumb:  Concentric reduction on lateral.  There may be a tiny avulsion fracture from the base of the proximal phalanx.  Gaps slightly on the PA on the radial aspect of the joint which may indicate RCL injury.      Assessment and Plan  Alejandro Dennis is a 63 y.o. male with left thumb PIP dislocation - dorsal, s/p reduction on 10/18 under sedation.  1. Short arm, thumb spica plaster splint fabricated for temporary immobilization.  2. Post reduction xrays reviewed.  3. OK for discharge to home from the ED with follow up on 10/28 with Alejandro EdgeValerie Gotie, Alejandro Dennis.  4. Plan for splint off, xrays, thumb exam and possible thumb spica cast for radial collateral injury  based on post reduction xray.    Christa See, MD as of 11:42 AM 11/08/2018

## 2018-11-08 NOTE — ED Provider Notes (Signed)
History     Chief Complaint   Patient presents with    Hand Injury     63 year old white male with complaint of injury to the left thumb after slipping and falling forward causing the thumb to strike the door of his home.  The patient notes having deformity of the thumb and inability to flex or extend.  Denies having other digital injury.  Reports no pain affecting the main body of the hand, wrist, elbow or shoulder.  Without tingling or numbness affecting the thumb or hand.          Medical/Surgical/Family History     Past Medical History:   Diagnosis Date    Carotid artery stenosis, asymptomatic, right     HLD (hyperlipidemia)         Patient Active Problem List   Diagnosis Code    Cervical radiculopathy M54.12    L ICA stenosis s/p L CEA 6/28 I65.21    Bilateral carotid artery disease I77.9            Past Surgical History:   Procedure Laterality Date    Carotid endarterectomy Right 07/19/2015    INGUINAL HERNIA REPAIR Right 2010     Family History   Problem Relation Age of Onset    Other Father         Carotid artery stenosis s/p BCEA    Lung cancer Father     Stroke Neg Hx     Heart attack Neg Hx     Heart surgery Neg Hx     Diabetes Neg Hx           Social History     Tobacco Use    Smoking status: Never Smoker    Smokeless tobacco: Never Used   Substance Use Topics    Alcohol use: Yes     Alcohol/week: 5.0 standard drinks     Types: 5 Standard drinks or equivalent per week    Drug use: No     Living Situation     Questions Responses    Patient lives with Spouse    Homeless No    Caregiver for other family member No    External Services None    Employment Employed    Domestic Violence Risk No                Review of Systems   Review of Systems   Musculoskeletal: Positive for joint swelling.   Skin: Negative for color change and wound.   Neurological: Negative for weakness and numbness.       Physical Exam     Triage Vitals  Triage Start: Start, (11/08/18 0915)   First Recorded BP:  165/70, Resp: 20, Temp: 36.2 C (97.2 F), Temp src: TEMPORAL Oxygen Therapy SpO2: 94 %, O2 Device: None (Room air), Heart Rate: 56, (11/08/18 0916)  .      Physical Exam  Vitals signs and nursing note reviewed.   Constitutional:       General: He is in acute distress.      Appearance: Normal appearance.   HENT:      Head: Normocephalic.      Mouth/Throat:      Mouth: Mucous membranes are moist.      Pharynx: Oropharynx is clear.   Eyes:      Extraocular Movements: Extraocular movements intact.      Pupils: Pupils are equal, round, and reactive to light.   Neck:      Musculoskeletal:  Normal range of motion and neck supple.   Cardiovascular:      Rate and Rhythm: Normal rate and regular rhythm.      Pulses: Normal pulses.      Heart sounds: Normal heart sounds. No murmur.   Pulmonary:      Effort: Pulmonary effort is normal. No respiratory distress.      Breath sounds: Normal breath sounds. No wheezing or rales.   Musculoskeletal:      Comments: Normal second through fifth digit range of motion on the left hand.  No injury to the metacarpal bones or wrist.  The left thumb appears to be dislocated with posterior displacement at the basal joint.  He is unable to extend or flex the left thumb.  Sensation intact over the distal aspect of the thumb with capillary refill less than 2 seconds.   Skin:     General: Skin is warm and dry.      Capillary Refill: Capillary refill takes less than 2 seconds.      Coloration: Skin is not pale.   Neurological:      General: No focal deficit present.      Mental Status: He is alert and oriented to person, place, and time.      Sensory: No sensory deficit.      Motor: No weakness.         Medical Decision Making   Patient seen by me on:  11/08/2018    Assessment:  63 year old white male with injury to the left thumb producing posterior deformity and inability to flex or extend the affected digit    Differential diagnosis:  Left thumb fracture, dislocation of the left thumb    Plan:   X-ray of the hand.  Will attempt reduction once the dislocation is confirmed.    Independent review of: Existing labs, XRays    ED Course and Disposition:  X-ray shows a dislocation of the basal joint of the left thumb.  The patient was adequately sedated with propofol.  The reduction procedure was carried out by the orthopedic surgeon, Dr. Newman Pies.  Post reduction film showed normal anatomic positioning of the basal joint.  A thumb spica cast was then placed by Dr. Diona Browner.  He will follow-up with Dr. Diona Browner in 1 week at the orthopedic clinic.       ED Course as of Nov 07 2100   Sun Nov 08, 2018   1027 X-ray of the left thumb shows an avulsion fracture at the base of the proximal phalanx with a posterior dislocation of the joint.  The case was discussed with the on-call orthopedic surgeon, Dr. Newman Pies.  She will evaluate the patient.  Plan -monitored sedation with IV propofol and reduction of the dislocated joint.      1055 6 mL's of 1% Carbocaine were administered as a digital block to the left thumb.      1126 The patient was given 120 mg of propofol in divided doses and titrated to clinical effect.  The reduction was carried out without difficulty.  The post reduction film showed normal anatomic positioning.  Dr. Diona Browner placed a thumb spica cast        Impression: Dislocation of the left thumb basal joint posteriorly without acute fracture      Plan: Procedural sedation with propofol, reduction by Dr. Newman Pies of the dislocated thumb joint.  Follow-up with Dr. Diona Browner in 1 week for further evaluation.    Eligha Bridegroom, MD  Seward Grater, MD  11/08/18 2109

## 2018-11-08 NOTE — Discharge Instructions (Addendum)
Keep the splint on the left thumb until you are seen by Dr. Domenic Polite in the orthopedic clinic.  Apply ice to the back of the thumb and keep the hand elevated above the level of the heart.  Take Tylenol 1000 mg and Advil 400 mg every 6-8 hours as needed for moderate pain.  If you experience numbness or increasing pain affecting the left thumb, notify the emergency department.  Do not drive or operate machinery for the next 24 hours.

## 2018-11-08 NOTE — ED Notes (Signed)
Discharge Instructions reviewed with patient.  The patient expressed understanding and is ready to be discharged.  Due to COVID-19 Infection Risk Reduction measures, patient and [facility] staff member are not using a pen to sign the Discharge Instructions.    Talkative and ambulatory, no distress noted. LUE splint c/d/i, CMS intact, cap refill less than 3 seconds. D/c instructions including pain control, splint education, aftercare and follow up care provided, voiced understanding. Post sedation d/c instructions including not to drive x 24 hours and avoiding alcohol discussed, voiced understanding. D/c home, wife coming to pick him up.

## 2018-11-08 NOTE — Procedures (Signed)
Procedure Report      PROCEDURE NOTE    Time out documentation completed in Procedure navigator prior to procedure:  Yes    Indications  63 year old male sustained nondominant left thumb PIP dislocation yesterday after a fall down stairs.    Procedure Details  After appropriate time out was conducted by the team, Dr. Johny Blamer provided sedation with propofol.  He had previously performed a ring block to the thumb with mepivacaine.      The dorsal dislocation of the thumb was palpated.  The deformity was recreated, traction placed on the tip of the thumb and distraction from the base of the P1.  Reduction was easily obtained with palmar translation.  A reduction was palpable and stable with gentle motion.  The collateral ligaments were stressed and found to be stable.    A post reduction lateral was obtained to confirm reduction that was concentric.      He was then placed in a well-padded, plaster short arm thumb spica.  Brisk cap refill was noted following splinting.    Findings  Dorsal dislocation of the PIP with easy reduction.    Complications  None    Condition  good    Plan/Orders  Discharge from the ED with routine follow up.    EBL: 0    Specimens  no    Disposition  Home    Larene Beach, MD  11/08/2018  11:53 AM

## 2018-11-09 ENCOUNTER — Ambulatory Visit
Admission: RE | Admit: 2018-11-09 | Discharge: 2018-11-09 | Disposition: A | Discharge status: Home / Self Care | Source: Ambulatory Visit | Attending: Vascular Surgery | Admitting: Vascular Surgery

## 2018-11-09 DIAGNOSIS — Z48812 Encounter for surgical aftercare following surgery on the circulatory system: Secondary | ICD-10-CM | POA: Insufficient documentation

## 2018-11-09 DIAGNOSIS — I6523 Occlusion and stenosis of bilateral carotid arteries: Secondary | ICD-10-CM | POA: Insufficient documentation

## 2018-11-09 DIAGNOSIS — I6522 Occlusion and stenosis of left carotid artery: Secondary | ICD-10-CM | POA: Insufficient documentation

## 2018-11-09 LAB — CV US CAROTID BILATERAL
Left Carotid Bulb EDV: 26.16 cm/s
Left Carotid Bulb PSV: 98.84 cm/s
Left Common Carotid Artery EDV Dist: 21.32 cm/s
Left Common Carotid Artery EDV Prox: 18.09 cm/s
Left Common Carotid Artery PSV Dist: 77.85 cm/s
Left Common Carotid Artery PSV Prox: 77.85 cm/s
Left External Carotid Artery EDV: 23.07 cm/s
Left External Carotid Artery PSV: 153.54 cm/s
Left ICA/CCA Ratio: 1.59
Left Internal Carotid Artery EDV Dist: 32.85 cm/s
Left Internal Carotid Artery EDV Mid: 22.83 cm/s
Left Internal Carotid Artery EDV Prox: 19.11 cm/s
Left Internal Carotid Artery PSV Dist: 65.47 cm/s
Left Internal Carotid Artery PSV Mid: 60.11 cm/s
Left Internal Carotid Artery PSV Prox: 123.95 cm/s
Left Subclavian Artery EDV: 10.66 cm/s
Left Subclavian Artery PSV: 133.47 cm/s
Left Vertebral Artery EDV: 9.37 cm/s
Left Vertebral Artery PSV: 41.69 cm/s
Right Carotid Bulb EDV: 19.82 cm/s
Right Carotid Bulb PSV: 101.05 cm/s
Right Common Carotid Artery EDV Dist: 21.93 cm/s
Right Common Carotid Artery EDV Prox: 23.07 cm/s
Right Common Carotid Artery PSV Dist: 70.67 cm/s
Right Common Carotid Artery PSV Prox: 89.68 cm/s
Right External Carotid Artery EDV: 25.64 cm/s
Right External Carotid Artery PSV: 142.68 cm/s
Right ICA/CCA Ratio: 0.79
Right Internal Carotid Artery EDV Dist: 28.28 cm/s
Right Internal Carotid Artery EDV Mid: 26.81 cm/s
Right Internal Carotid Artery EDV Prox: 13.81 cm/s
Right Internal Carotid Artery PSV Dist: 70.29 cm/s
Right Internal Carotid Artery PSV Mid: 60.93 cm/s
Right Internal Carotid Artery PSV Prox: 56.05 cm/s
Right Subclavian Artery EDV: 23.74 cm/s
Right Subclavian Artery PSV: 146.6 cm/s
Right Vertebral Artery EDV: 13.42 cm/s
Right Vertebral Artery PSV: 50.9 cm/s

## 2018-11-10 ENCOUNTER — Telehealth: Payer: Self-pay | Admitting: Adult Health

## 2018-11-10 NOTE — Telephone Encounter (Signed)
Patient requesting call back to discuss swelling of hand/knuckles.  Call back [phone].

## 2018-11-11 NOTE — Progress Notes (Signed)
Orthopaedic ED/Urgent Care Follow-up     Injury: left thumb PIP dislocation. LEFT    Injury Date: 11/08/2018    Alejandro Dennis presented to orthopedic clinic today for a follow up to emergency care of thumb dislocation and fracture.   Thumb described as outstretched and catching his fall.    Returned to clinic with a thumb spica splint which was removed for repeat XR. Patient is right hand dominant   The patient is doing fairly well.  Pain well controlled on current medications. Patient is using NSAID 800 mg 1-x/day PRN. Denies fevers or chills.  No other concerns today.       Medications, allergies, and surgical histories: Reviewed and confirmed.  Past medical history: Reviewed and confirmed.   Social history: Reviewed and confirmed.    Occupation: Geologist, engineering. Primarily seated.   Family history: Reviewed and confirmed.    All ED and/or Urgent Care notes and imaging reviewed.  Review of prior history was performed and noted as above.  A 12 point review of symptoms was performed and found to be otherwise negative.    Exam:     Vital Signs: BP 144/79    Pulse 75     Constitutional: alert and oriented, appears stated age  Neuro Psych: Affect normal, appropriate mood  Eyes: extraocular movements intact    Ears, Nose, Mouth, Throat: external pinna normal, hearing grossly intact, moist oral mucosa, no gross nasal deformity  Cardiovascular: pulse regular, extremities warm and well perfused, no peripheral edema   Respiratory: breathing unlabored on RA, symmetric chest expansion, no difficulty speaking  Musculoskeletal: no gross motor abnormalities. No gross scoliosis or kyphosis.   Gait: Normal.   Skin: There are no rashes or lesions noted.    Focused orthopaedic examination of the extremity    Comprehensive exam of the left hand extremity:  Observation: There is evidence of swelling throughout dorsal thumb, hand and remaining fingers. NO deformity, there is a tiny skin tear at the palmar surface base of thumb.    Palpation: There is focal tenderness to palpation through the base of the thumb joint and at P1.    ROM: R motion at the thumb is limited secondary to pain and swelling.   Strength:  Strength testing not completed due to pain.   Sensation: Sensation is intact to both radial and ulnar border of all digits.  Circulation: Radial and ulnar pulses were well palpable, and distal extremity warm with a brisk capillary refill throughout.      Imaging:  Xray was personally reviewed and demonstrates:  fracture of the medial aspect of left 1st MCP joint. possible fracture or ossicle at the lower base of distal 1st phalanx. Persistent gaping at the joint radial side.        Assessment/Plan:    Alejandro Dennis is a 63 yo male who sustained a thumb dislocation following a fall into a door on 11/08/2018. Swelling and pain have improved per patient report. Recommend patient continue elevation, ice and NSAID medications.     Gaping at the joint observed on XR. Unable to perform laxity testing secondary to pain. Likely radial collateral ligament tear at P1, possible ulnar collateral ligament tear, MRI was ordered to assess. Follow up with hand/upper extremity.     Patient was placed in thumb SPICA cast today. Advised if cast becomes loose fitting to call for an appointment to replace cast.       Follow up:  MRI of left hand, follow up with Dr. Romeo Apple  with results and treatment options.     Patient verbalized understanding of the above plan.     [name], AGNP    The above document was generated using voice recognition software. Reasonable attempts at correction were made. Please excuse any unintended transcription errors

## 2018-11-11 NOTE — Telephone Encounter (Signed)
Spoke with patient 11/10/2018 1540. He is on his way to [place] for business and noticed his hand and knuckles are becoming more swollen. Shakeel still has full ROM and sensation per conversation, he was advised if swelling worsens, he loses sensation, has color changes in the hand etc he would need to have his hand checked and/or cast replaced at the closest ED or urgent care center. It is recommended he use ibuprofen for pain and swelling relief, as well as ice and elevation as much as possible. Patient reports he is taking prescription 800 mg ibuprofen 1x/day and will take BID. I advised patient of max daily dose of ibuprofen and to take this medication with food to avoid stomach upset. He will call if he is concerned or has any further questions. He has a follow up appointment with Dr. Domenic Polite this Thursday.     IMPORTANT: Do NOT take more than 3200 mg of NSAID medication in a 24-hour period. Do not take NSAIDs if you have renal disease.     [name], NP

## 2018-11-12 ENCOUNTER — Ambulatory Visit: Admitting: Adult Health

## 2018-11-12 ENCOUNTER — Ambulatory Visit
Admission: RE | Admit: 2018-11-12 | Discharge: 2018-11-12 | Disposition: A | Discharge status: Home / Self Care | Source: Ambulatory Visit

## 2018-11-12 ENCOUNTER — Encounter: Payer: Self-pay | Admitting: Adult Health

## 2018-11-12 VITALS — BP 144/79 | HR 75

## 2018-11-12 DIAGNOSIS — S63105A Unspecified dislocation of left thumb, initial encounter: Secondary | ICD-10-CM

## 2018-11-12 DIAGNOSIS — S63105S Unspecified dislocation of left thumb, sequela: Secondary | ICD-10-CM

## 2018-11-12 DIAGNOSIS — S62292D Other fracture of first metacarpal bone, left hand, subsequent encounter for fracture with routine healing: Secondary | ICD-10-CM

## 2018-11-12 NOTE — Patient Instructions (Addendum)
MRI of the left hand  Follow up with Burgess Estelle

## 2018-11-13 ENCOUNTER — Ambulatory Visit
Admission: RE | Admit: 2018-11-13 | Discharge: 2018-11-13 | Disposition: A | Discharge status: Home / Self Care | Source: Ambulatory Visit | Attending: Adult Health | Admitting: Adult Health

## 2018-11-13 DIAGNOSIS — S56312A Strain of extensor or abductor muscles, fascia and tendons of left thumb at forearm level, initial encounter: Secondary | ICD-10-CM

## 2018-11-13 DIAGNOSIS — S63105S Unspecified dislocation of left thumb, sequela: Secondary | ICD-10-CM

## 2018-11-13 DIAGNOSIS — S63642A Sprain of metacarpophalangeal joint of left thumb, initial encounter: Secondary | ICD-10-CM

## 2018-11-13 NOTE — Procedures (Signed)
Cast/Splint application   Date/Time: 11/12/2018 11:40 AM   Performed by: Marlene Lard, LPN   Authorized by: [name], NP   Consent given by: patient  Injury  Location details: left hand  Procedure  Immobilization: cast  Cast type: thumb spica  Supplies used: cotton padding and fiberglass  Fiberglass quantity of rolls: 2  Post-procedure assessment  Patient tolerance: patient tolerated the procedure well with no immediate complications

## 2018-11-16 ENCOUNTER — Encounter: Payer: Self-pay | Admitting: Adult Health

## 2018-11-18 ENCOUNTER — Ambulatory Visit: Admitting: Adult Health

## 2018-11-24 ENCOUNTER — Encounter: Payer: Self-pay | Admitting: Orthopedic Surgery

## 2018-11-24 ENCOUNTER — Ambulatory Visit: Attending: Orthopedic Surgery | Admitting: Rehabilitative and Restorative Service Providers"

## 2018-11-24 ENCOUNTER — Ambulatory Visit: Admitting: Orthopedic Surgery

## 2018-11-24 VITALS — BP 142/72 | HR 84 | Ht 68.0 in | Wt 163.0 lb

## 2018-11-24 DIAGNOSIS — S63115D Dislocation of metacarpophalangeal joint of left thumb, subsequent encounter: Secondary | ICD-10-CM | POA: Insufficient documentation

## 2018-11-24 DIAGNOSIS — S63269A Dislocation of metacarpophalangeal joint of unspecified finger, initial encounter: Secondary | ICD-10-CM

## 2018-11-24 NOTE — Progress Notes (Signed)
Sent via: eRecord EMR    Physician attestation for Plan of Care: Physician:  Dr. Candis Shine  Per signature, I have reviewed and agree with the documented plan of care.    Signature:____________________________________________________  ____________________________________________________________  Plan of Care     The [id number] co-signature on this note indicates that they have reviewed this evaluation and agree with the documented goals and plan of care.              [place] [facility]  Orthosis Evaluation    Name: Alejandro Dennis  DOB: [birth year]  Referring Physician: Doylene Canard, MD  Todays Date: 11/24/2018  Date of Injury: 11/07/18  Date of Surgery: N/A  Return to Physician Date: 12/14/18      Diagnosis:     ICD-10-CM ICD-9-CM   1. Closed dislocation of metacarpophalangeal joint of left thumb, subsequent encounter  S63.115D V58.89         Alejandro Dennis is a 63 y.o. male who is here today for the design and custom fabrication of a L hand based thumb spica orthosis following a L thumb MCP dislocation. Patient reports he was getting ready to go for a run when he slipped on the last step of his stairs and ran into the door.  Patient reports the next morning he went to [facility] ED. X-rays and MRI were completed.  He was splinted until he followed up with Dr. Candis Shine and then casted until today 11/24/18.      Encounter Diagnosis   Name Primary?    Closed dislocation of metacarpophalangeal joint of left thumb, subsequent encounter Yes      Treatment:  Splint fabrication and instruction in splint use.     Splint type:  L hand based thumb spica with IP free    Splint wearing schedule:  Wear at all times, remove 3x/day for AROM exercises. Patient provided with handout.     Communication:  Instructed patient     Plan:  Follow-up with provider. Patient scheduled to follow up with the doctor on 12/14/18.     Thank you for referring this patient to [place].    Breyana Follansbee, OT,CHT       TIME + FUNCTIONAL REPORTING    Total Minutes of treatment        Total Non-Treatment time(rest, etc)    Total Service Based Min. of Treatment    Total Time-Based minutes of treatment 30   Orthotic fitting/training initial 30   Orthotic fitting/training subsequent

## 2018-11-24 NOTE — Progress Notes (Signed)
Attending Addendum    I have seen and examined the patient myself, and I agree with the history, physical exam, and plan as documented by Alejandro Liming, PA.    Alejandro Dennis is a 63 y.o. male who presents 2-week status post left thumb MCP joint dislocation reduced by Dr. Diona Browner.  He has been in a cast.  Has had MRI scan done.  Presents for follow-up today.  Discomfort improved.    Exam left thumb with edema.  Healing wound over the distal thenar eminence.  No signs of infection.  Mild tenderness at the thumb MCP joint.  Gentle varus and valgus stressing of the MCP joint is relatively stable.  Thumb IP motion diminished but intact.    Left thumb MRI scan is independently reviewed.  There is evidence of injury to the ulnar collateral ligament and likely avulsion of the proximal portion of the radial collateral ligament.  Possible interposition into the joint.    Patient doing fairly well 2 weeks out from his left thumb MCP joint dislocation.  The joint is rather stiff today but no evidence of instability on physical exam.  MRI scan is reviewed with the patient demonstrating position of the collateral ligament injuries and potential incarceration in the joint.  I counseled him on treatment options to include continued conservative care versus surgery in the form of ligament repair versus reconstruction.  I discussed he may end up with a stiff joint regardless of treatment modality.  Stiffness is preferred over instability and he understands this.  After extensive conversation plan to pursue continued conservative care.  He will see the hand therapist today who will fabricate an Orthoplast brace to protect the joint.  Recommend he work on gentle thumb IP motion.  Plan to reevaluate in 3 to 4 weeks time.    Pennie Banter. Romeo Apple, MD  Assistant Professor of Clinical Orthopaedics  Division of Hand, Wrist, & Upper Extremity  [facility]   Office: [phone]    NEW PATIENT ORTHOPAEDIC VISIT    HPI:  Alejandro Dennis is right hand dominant 63 y.o. male who presents as a new patient to Dr. Romeo Apple after a left thumb MCP joint dislocation which was sustained after he fell down his last step at home and "jammed the thumb".  He was initially seen at [facility] on 11/08/2018 and this was referred to [place] by Dr. Newman Pies.  Ultimately, he had an MRI to evaluate intrinsic thumb ligaments and this revealed a tear to the radial collateral ligament as well as a partial tear to the UCL.  Patient has been casted for the last week and a half.  Alejandro Dennis reports the swelling has subsided he has minimal discomfort.  He notes he is fairly active for his age, golfing routinely and running on a regular basis.  He describes no sensory complaints.  He reports he had full motion and notes significant discomfort in the thumb prior to the injury.    Medical History:    Past Medical History:   Diagnosis Date    Carotid artery stenosis, asymptomatic, right     HLD (hyperlipidemia)        Past Surgical History:   Procedure Laterality Date    Carotid endarterectomy Right 07/19/2015    INGUINAL HERNIA REPAIR Right 2010       Allergies   Allergen Reactions    Ampicillin Rash    No Known Latex Allergy  Current Outpatient Medications:     amLODIPine (NORVASC) 5 MG tablet, Take 1 tablet (5 mg total) by mouth daily, Disp: 90 tablet, Rfl: 3    sildenafil (REVATIO) 20 MG tablet, TAKE 2 3 TABLETS BY MOUTH EVERY DAY AS NEEDED, Disp: , Rfl:     meloxicam (MOBIC) 7.5 MG tablet, Take 1 tablet (7.5 mg total) by mouth 2 times daily, Disp: 60 tablet, Rfl: 1    testosterone cypionate (DEPO-TESTOSTERONE) 200 MG/ML injection, INJECT 400MG  (2ML) INTRAMUSCULAR EVERY 2.5 WEEKS (CODE F) MDD 400/2.5W, Disp: , Rfl: 0    aspirin 81 MG EC tablet, Take 81 mg by mouth every morning   , Disp: , Rfl:     ibuprofen (ADVIL,MOTRIN) 200 MG tablet, Take 600 mg by mouth every 6 hours as needed   Takes 3 pills at a time , Disp: , Rfl:      rosuvastatin (CRESTOR) 10 MG tablet, Take 1 tablet (10 mg total) by mouth daily, Disp: 30 tablet, Rfl: 2    Social History     Socioeconomic History    Marital status: Married     Spouse name: Not on file    Number of children: Not on file    Years of education: Not on file    Highest education level: Not on file   Occupational History    Not on file   Tobacco Use    Smoking status: Never Smoker    Smokeless tobacco: Never Used   Substance and Sexual Activity    Alcohol use: Yes     Alcohol/week: 5.0 standard drinks     Types: 5 Standard drinks or equivalent per week    Drug use: No    Sexual activity: Not on file   Social History Narrative    Not on file         Family History   Problem Relation Age of Onset    Other Father         Carotid artery stenosis s/p BCEA    Lung cancer Father     Stroke Neg Hx     Heart attack Neg Hx     Heart surgery Neg Hx     Diabetes Neg Hx        Information supplemental to this note including Past Medical/Surgical History, Medications, Allergies, Social History, Family History, Review of Systems is recorded in the annotated patient questionnaire of this date and has been scanned into the patient's chart.      Exam:  BP 142/72    Pulse 84    Ht 1.727 m (5\' 8" )    Wt 73.9 kg (163 lb)    BMI 24.78 kg/m   Estimated body mass index is 24.78 kg/m as calculated from the following:    Height as of this encounter: 1.727 m (5\' 8" ).    Weight as of this encounter: 73.9 kg (163 lb).  Constitutional: No acute distress, alert, oriented, and responding to questions appropriately, appearing stated age.  Musculoskeletal:   Cast extending past the IP joint was on for a initial visit and exam.  This was removed.  There is mild fusiform swelling to the thumb.  He had significant stiffness most notably at the IP joint of the left thumb.  Sensation light touch is intact distally.  There is slight laxity noted to the thumb with radial deviated stresses.  Ulnar deviated stresses do not  reveal gross instability or laxity.  He has mild tenderness at the base of  the thumb over the MCP joint both on the radial and ulnar margin of the digit.  Is nontender at the basal joint.    Imaging: I have personally and independently reviewed radiographs of the left thumb dated 11/08/2018 as well as an MRI dated 11/13/2018 revealing a tear to the radial collateral ligament as well as a partial tear to the ulnar collateral ligament.  No obvious avulsion fracture appreciated.    Assessment: Alejandro Dennis is a 63 y.o. male who presents with left nondominant thumb first MCP joint dislocation.  MRI has revealed a RCL tear and partial tear to the UCL.  No gross instability or laxity noted on clinical exam.  Treatment modalities were discussed including operative and nonoperative management.  He has elected to proceed with nonoperative management he understands potential for posttraumatic arthrosis in the affected joint.    He was transitioned out of his cast to a thermoplastic thumb spica brace and will begin formal physical therapy working on range of motion.  We will see him back in approximately 3 and half weeks (6 weeks status post injury) per further assessment and progressive strengthening.    Patient was seen and evaluated by attending physician Dr. Romeo Apple'Donnell    1. Follow-up: Return in about 3 weeks (around 12/15/2018).    My findings and impression were discussed with the patient. Questions were invited and answered to the patients satisfaction.   Patient will call with increasing difficulties, questions, concerns otherwise.         Answers for HPI/ROS submitted by the patient on 11/22/2018   Left hand pain  What is your goal for today's visit?: determine if healing progress is acceptable for full recovery without surgery  Handedness: Right Handed  Date of onset: : 11/07/2018  Was this the result of an injury?: Yes  What is your pain level?: 2/10  Please describe the quality of your pain: : dull ache  What  diagnostic workup have you had for this condition?: MRI scan, X-ray  What treatments have you tried for this condition?: ice  Progression since onset: : gradually improving  Is this a work related condition? : No  Current work status: : usual activities  Fever: No  Chills: No  Weight loss: No  Malaise/fatigue: No  Rash: No  Dry skin: No  Itching: No  Wound : No  Headache: No  Hearing loss: No  Sore throat: No  Dental problem: No  Blindness: No  Visual disturbance: No  Chest pain: No  Palpitations: No  Leg cramps with exercise: No  Leg swelling: No  Fainting: No  Heartburn: No  Nausea: No  Vomiting: No  Diarrhea: No  Constipation: No  Blood in stool: No  Cough : No  Apnea: No  Shortness of breath: No  Frequency: No  Urgency: No  Blood in urine: No  Incomplete emptying: No  Falls: No  Neck pain: Yes  Back pain: Yes  Joint swelling: No  Muscle spasms: No  Muscle cramps: No  Easy to bruise/bleed: No  Frequent infections: No  Numbness: No  Tingling: Yes  Seizures: No  Loss of consciousness: No  Weakness: Yes  Depression: No  Nervous/anxious: No  Memory loss: No

## 2018-11-26 ENCOUNTER — Ambulatory Visit: Admitting: Orthopedic Surgery

## 2018-12-11 ENCOUNTER — Encounter: Payer: Self-pay | Admitting: Physical Medicine and Rehabilitation

## 2018-12-11 ENCOUNTER — Other Ambulatory Visit: Payer: Self-pay | Admitting: Physical Medicine and Rehabilitation

## 2018-12-11 ENCOUNTER — Ambulatory Visit
Admission: RE | Admit: 2018-12-11 | Discharge: 2018-12-11 | Disposition: A | Discharge status: Home / Self Care | Source: Ambulatory Visit | Attending: Physical Medicine and Rehabilitation | Admitting: Physical Medicine and Rehabilitation

## 2018-12-11 ENCOUNTER — Ambulatory Visit: Admitting: Physical Medicine and Rehabilitation

## 2018-12-11 VITALS — BP 132/85 | HR 64 | Temp 96.8°F | Resp 18 | Ht 68.0 in | Wt 163.0 lb

## 2018-12-11 DIAGNOSIS — M545 Low back pain, unspecified: Secondary | ICD-10-CM

## 2018-12-11 DIAGNOSIS — M533 Sacrococcygeal disorders, not elsewhere classified: Secondary | ICD-10-CM

## 2018-12-11 MED ORDER — LIDOCAINE HCL 2 % IJ SOLN *I*
0.5000 mL | Freq: Once | INTRAMUSCULAR | Status: AC | PRN
Start: 2018-12-11 — End: 2018-12-11
  Administered 2018-12-11: .5 mL

## 2018-12-11 MED ORDER — IOHEXOL 240 MG/ML (OMNIPAQUE) IV SOLN *I*
0.5000 mL | Freq: Once | INTRAMUSCULAR | Status: AC | PRN
Start: 2018-12-11 — End: 2018-12-11
  Administered 2018-12-11: .5 mL

## 2018-12-11 MED ORDER — DEXAMETHASONE SOD PHOSPHATE PF 10 MG/ML IJ SOLN *I*
12.0000 mg | Freq: Once | INTRAMUSCULAR | Status: AC | PRN
Start: 2018-12-11 — End: 2018-12-11
  Administered 2018-12-11: 12 mg

## 2018-12-11 MED ORDER — LIDOCAINE HCL 1 % IJ SOLN *I*
1.5000 mL | Freq: Once | INTRAMUSCULAR | Status: AC | PRN
Start: 2018-12-11 — End: 2018-12-11
  Administered 2018-12-11: 1.5 mL via SUBCUTANEOUS

## 2018-12-11 NOTE — Patient Instructions (Signed)
Post Spinal Injection / Procedure Instructions    Instructions:  Notify the office of the doctor who performed your procedure if you experience any of the following:   Fever of 100 degrees Fahrenheit or 38 degrees Celsius or greater.   Excessive swelling or redness at the injection site.   Excessive pain or numbness that lasts longer than 72 hours after your injections.   Severe headache that goes away when you lie flat and comes back when you sit upright    Activity:   Rest after your injection for 24 hours, then return to your usual activities.  This includes any restrictions your physician may have prescribed.   Do not drive the day of your injection.    Care of the injection site:   You may ice the injection site for local discomfort.  Cover ice with a cloth and apply light pressure.  Never place ice directly on the skin.   Apply ice for 15 minutes on then off for 1 hour.  Repeat as needed.   Take band aids off the following day.    For diabetic patients:   Check your blood sugar level every 8 hours for the next 72 hours.  If it is greater than 250, call your primary care physician.    Medications:   You may continue taking your pre-injection medications as previously prescribed.   Remember, the steroid you had injected today usually takes 1 to 3 days to take effect, however it can take up to 7 days.    Contacts/Questions:  In case of an urgent issue or questions:   Dr. Serita Grit secretary, Lattie Haw can be reached at [phone]   Dr. Vista Lawman secretary, Katharine Look can be reached at [phone]   Dr. Glenford Bayley secretary, Caryl Pina can be reached at [phone]   Dr. Eli Hose secretary, Katharine Look can be reached at [phone]   Call the answering service at [phone] after 4:30 PM, on the weekend or a holiday.  Ask for the physician on call.    Follow-up Office Visit:  You need to call the appointment center at [phone] for a follow-up office visit with DR Karle Starch  in  ABOUT 2  MONTHS

## 2018-12-11 NOTE — Progress Notes (Addendum)
Pt denies taking abx and steroids. Pt is taking aspirin 81. Patient has a driver. Pt denies metal implants

## 2018-12-11 NOTE — Procedures (Signed)
Date/Time: 12/11/2018  7:55 AM EST  Tendons, Ligaments, Muscles, and Joint/Bursa Injection(s): SI Injection - CPT 27096  Laterality: Right  Contrast - Right: 0.5 mL iohexol 240 MG/ML  Anesthetic medications - Right: 0.5 mL lidocaine 2 %; 1.5 mL lidocaine hcl 1 %  Steroid medication - Right: 12 mg dexamethasone PF 10 MG/ML    Attending Physician:  Carolynn Sayers. Karle Starch, M.D.    Indication: SI Joint Region Pain M53.3  Procedure:  Right SI Joint Therapeutic Injection CPT Code:  27096    After a thorough discussion of the risks, benefits, potential complications, including but not limited to infection and bleeding, and alternatives to injection therapy the patient gave informed written consent to proceed.  The patient was placed prone on the fluoroscopic table and then using fluoroscopic guidance the Right SI joint was identified.  This area was then prepped with Clorhexidine X 3 which was allowed to dry. Then using 1.5 cc of 1% lidocaine the skin and subcutaneous tissue was anesthetized.  Then using a 22 gauge spinal needle the joint space was entered without difficulty.  1/2 cc of Omnipaque 240 was slowly infused showing good spread within the joint space.  Then 12 mg of Dexamethasone Sodium Phosphate and 0.5 cc of 2% lidocaine was slowly infused. The patient tolerated the procedure well, no complications were noted. The patient was observed in the recovery room without problems was released home in the care of his driver.  I will see the patient back in 2-4 weeks to review his response to the steroid component.  The patient understands that this injection is a part of their comprehensive rehabilitation program and they need to continue their functional rehabilitation.

## 2018-12-15 ENCOUNTER — Encounter: Payer: Self-pay | Admitting: Orthopedic Surgery

## 2018-12-15 ENCOUNTER — Ambulatory Visit: Admitting: Orthopedic Surgery

## 2018-12-15 VITALS — BP 152/90 | HR 77 | Ht 68.0 in | Wt 163.0 lb

## 2018-12-15 DIAGNOSIS — S63269A Dislocation of metacarpophalangeal joint of unspecified finger, initial encounter: Secondary | ICD-10-CM

## 2018-12-15 NOTE — Progress Notes (Signed)
Orthopedic Surgery Progress Note    CC: Left thumb MCP joint dislocation    History: Alejandro Dennis is a 63 y.o. male who presents for follow-up of left thumb MCP joint dislocation.  Injury occurred 5 weeks ago.  He has been wearing an Orthoplast brace.  He notes continued improvement in pain.  He is now able to use the hand for activities to include buttoning up his shirt.  No new complaints today.  He does note an area of discomfort in the skin of the volar aspect of the thumb MCP joint where previous scab was.    Exam:  Constitutional: No acute distress, alert, oriented  MSK:  left upper extremity   Skin intact, no signs of infection  Warm and well perfused  Brisk capillary refill throughout  Baseline motor and sensory exams  Patient is able to make a composite fist  Minimal if any tenderness about the thumb MCP joint  Nontender over the thumb A1 pulley, no crepitations  Thumb MCP and IP motion overall well-maintained with ability to oppose to the tip of the ring finger  Gentle stress testing of the MCP joint of the thumb is stable    Assessment: Alejandro Dennis is a 63 y.o. male with left thumb MCP joint dislocation    Plan: Overall the patient is doing very well 5 weeks out from his closed reduction was left thumb MCP joint.  He has improved motion and overall much improved pain.  At this time recommend he go to hand therapy to work on motion and some modalities.  He may wean out of the Orthoplast splint over the next week or so.  I discussed with him risk of stiffness as well as risk of instability and given the examination today I think the thumb MCP joint will be quite stable for him.  Should he have issues in the future could always perform ligament reconstruction versus joint arthrodesis and he understands this.  Questions elicited and answered.  Plan to reevaluate in 6 weeks time for repeat clinical exam of the left thumb.    Return in about 6 weeks (around 01/26/2019).    Girtha Rm. Candis Shine, MD  Assistant  Professor of Clinical Orthopaedics  Division of Hand, [place] [facility]   Office: [phone]    This note was dictated using speech recognition software.  A thorough attempt was made to proof read and correct any errors.

## 2018-12-21 ENCOUNTER — Ambulatory Visit

## 2018-12-21 DIAGNOSIS — S63115D Dislocation of metacarpophalangeal joint of left thumb, subsequent encounter: Secondary | ICD-10-CM

## 2018-12-21 NOTE — Progress Notes (Signed)
Sent via: eRecord EMR    Physician attestation for Plan of Care: Physician: Dr. Candis Shine   Per signature, I have reviewed and agree with the documented plan of care.    Signature: ___________________________________________________  ____________________________________________________________  Plan of Care     The [id number] co-signature on this note indicates that they have reviewed this evaluation and agree with the documented goals and plan of care.         [place]  OT Initial Evaluation    Name: Alejandro Dennis  DOB: Jun 30, 1955  Referring Physician: Doylene Canard, MD  Todays Date: 12/21/2018  Visit #: 2  Return to Physican Date: 01/26/19  Diagnosis:     ICD-10-CM ICD-9-CM   1. Closed dislocation of metacarpophalangeal joint of left thumb, subsequent encounter  S63.115D V58.89       Subjective    Alejandro Dennis is a 63 y.o. male who is present today for evaluation and treatment of L thumb MP joint dislocation. On 11/07/18, patient reports he was getting ready to go for a run when he slipped on the last step of his stairs and ran into the door. Patient reports the next morning he went to [facility] ED. X-rays and MRI were completed.  He was splinted until he followed up with Dr. Candis Shine and then casted until today 11/24/18. Patient was referred to therapy that day for custom orthotic fabrication, he has utilized this without issue. Follow up with orthopedic office on 11/24 and patient referred back to therapy for initiation of treatment.   Date of surgery: NA  Onset date: 11/07/18  Mechanism of injury/history of symptoms:  Slipped on step and jammed hand into door   Diagnostic tests: X-ray  Prior therapy: NA  Relevant Comorbidities/Personal Factors: High Blood Pressure and spine issues/neck.  These are relevant factors as they: may be a contraindication to certain exercises, thus impacting treatment outcome and plan of care.   Surgical history: NA  Current  Medications: Reviewed by therapist     Occupation and Activities  Work status/demands: Naval architect - keyboarding, site Psychologist, occupational Environment: Lives with spouse  Stresses/physical demands of home: [place], Housekeeping, Gardening/Yard Work and Materials engineer Social History(including patient responsibilities): N/A  Sport(s): Surveyor, quantity applicable): NA  Relevant symptoms: Aching, Pain , Decreased ROM, Decreased strength, Swelling  Pain level: (0 - 10 scale): Now 0 Best 0 Worst 3   Acceptable 0  Pain Details: Patient experiences occasional pulling/achy pain in left thumb during active use of hand, 3/10 intensity.   Symptom Detail: No paresthesia in left thumb, mild hypersensitivity at volar MP joint due to scarring and previous scab in this area  Assistive device:  splint - approx 20% of time   Prior level of function: Pt reports no limitations with performance of ADLs or functional activities prior to onset of current sxs/surgery.  Pt stated goals: Regain AROM and strength for functional return of left thumb      Functional Limitations:  Activity Limitation Description   Dressing mild limitation Fastening button, belt    Bathing mild limitation Careful    Grooming no limitation    Cooking mild limitation Grasping, pinching and lifting    Home Management moderate limitation Outdoor home management   Work Tasks no limitation    Driving/Transportation no limitation    Leisure Activities/Hobbies severe limitation Golf        Objective:    Observation/Palpation: Edema  Edema Measurements (cm) Right Left    12/21/18 12/21/18   Thumb MP  8.9 cm  10.5 cm           ROM/Strength (unnoted implies [facility] or NA Manual Muscle Testing Scores_/5 )    UE AROM AROM MMT MMT    Right Left Right Left    12/21/18 12/21/18 NT NT   Wrist       Flexion 85 75     Extension 45 48     Radial Deviation       Ulnar Deviation       Pronation       Supination             AROM     Right Left     12/21/18 12/21/18    THUMB MP Extension/Flexion 52 28    IP Extension/Flexion 75 52    Radial Abduction 40 39    Palmar Abduction 45 37    Opposition WNL Tip 5     Thumb to base of Little Finger WNL 3.5 cm      Hand Strength: Jamar dynamometer (lbs)    Position Right Left    12/21/18 12/21/18   1     2  85 62   3       Pinch (lbs) Right Left    12/21/18 12/21/18   Lateral 25 15   Tip (2 point)     Tip (3 point)       Hand dominance:  right     Functional Outcome Measure  DASH Score:    12/21/18 21.6     Assessment:  Findings consistent with 63 y.o. male here today for formal evaluation and initiation of treatment for left thumb MP joint dislocation. Patient is now 6 weeks 2 days post onset of injury, continues to utilize custom fabricated short opponens orthosis during activity. Patient experiences occasional pulling/achy pain in left thumb during active use of hand, 3/10 intensity. He denies paresthesia in left hand or thumb. He does have a localized area at volar MP joint in which abrasion and scabbing was present, now mildly firm with scar tissue and mild hypersensitivity is noted. Mild to moderate edema persists in left thumb at this time per circumferential measurements. Mild wrist AROM limitations, moderate left thumb AROM limitation is noted. Strength is limited at mild to moderate level throughout. Patient is appropriate for hand therapy intervention to improve above deficits for functional return of left hand.     Occupational profile and medical/therapy history is brief releated to presenting problem.  An assessment that identifies 1-3 performance deficits (relating to physical, cognitive, or psychosocial skills) that result in activity limitations and/or participation restrictions.  Clinical decision making of Low analytic complexity, which includes an analysis of the occupational profile, analysis of data from problem focused assessment(s), and consideration of a limited number of treatment options. Patient presents with no  comorbidities that affect occupational performance. Modification of tasks or assistance (e.g., physical or verbal) with assessment(s) is not necessary to enable completion of evaluation component.   A Low Complexity evaluation was performed today.    Prognosis:  Good   Contraindications/Precautions/Limitation:  Per diagnosis    Goal Length Status   Patient will demonstrate independence in home exercise program 2 weeks  In progress    Patient will reduce pain intensity of left thumb to 0-1/10 with active use 6 weeks In Progress   Patient will reduce edema at left thumb MP level by 0.4-0.6  cm per circumferential measurements  4 weeks In Progress   Patient will imporve L wrist and thumb AROM to within 5 deg of opposite hand to return ability to donn clothing and shower with good use of left hand  6 weeks In Progress   Patient will improve L grip strength to WNL in comparison to opposite side to return ability to grasp and lift items while cooking  6 weeks In Progress   Patient will improve L thumb pinch strengths by 5-8 lbs for improved ability to perform indoor and outdoor home management tasks  8 weeks In Progress   Patient will improve L thumb activity tolerance as demonstrated by improved ability to play golf  8 weeks In Progress     Treatment Plan:   Patient/family involved in developing goals and treatment plan: Yes  Freq 1 times per week for 8 week    Treatment plan inclusive of:   Exercise: AROM, AAROM, PROM, Stretching, Strengthening, Progressive Resistive, Coordination   Manual Techniques:  Joint mobilization, Soft tissue release/massage, Edema management   Modalities: Cold pack, Electrical Stimulation, unattended, Fluidotherapy, Moist heat, Paraffin and Ultrasound   Functional: Sports specific, Functional rehab   CPTs: Therapeutic Exercise (97110), Therapeutic Activity (97530), Manual Therapy (97140), Initial Orthotic Fitting/Training ([id number]), Subsequent Orthotic Fitting/Training ([phone]), Ultrasound (09811(97035),  Paraffin Bath [phone]), Electric Stim, unattended (97014)/([phone]), Hot/cold Packs (97010) and Fluidotherapy ([phone](97124)    Treatment today consisted of:    Treatment: Details   THERAPEUTIC EXERCISE HEP instruction on MH, retrograde massage, scar massage (volar abrasion area), thumb and wrist AROM all planes. Handouts provided.                MANUAL            NEUROMUSCULAR RE-ED            THERAPEUTIC ACTIVITY            SELF-CARE MANAGEMENT        MODALITIES            OTHER            Thank you for referring this patient to [facility].    Alejandro Dennis, OT,CHT        OT Time  Reporting    Service-Based Procedures/ Modalities    Evaluation (high, moderate, low) 30   Re-evaluation    Electric stimulation (unattended)    Paraffin Bath    Fluidotherapy ([employer])    Total service-based billable procedures 30       Time-Based  Procedures / Modalities    Manual Therapy    Massage    Neuromuscular Re-Ed    Self-Care Home Management    Therapeutic Activities    Therapeutic Exercise 15   Electric Stimulation (manual)    Iontophoresis    Orthotic Fit/Train Initial Encounter    Orthotic Fit/Train Subsequent Enc    Ultrasound    Total Time-Based Billable Procedures 45       Total Treatment Time 45

## 2018-12-28 ENCOUNTER — Ambulatory Visit

## 2019-01-04 ENCOUNTER — Ambulatory Visit: Attending: Orthopedic Surgery

## 2019-01-04 DIAGNOSIS — S63115D Dislocation of metacarpophalangeal joint of left thumb, subsequent encounter: Secondary | ICD-10-CM | POA: Insufficient documentation

## 2019-01-04 LAB — UNMAPPED LAB RESULTS
Hematocrit (HT): 51 % — NL (ref 40–52)
Hemoglobin (HGB) (HT): 17.2 g/dL — NL (ref 13.0–18.0)
MCHC (HT): 33.5 g/dL — NL (ref 32.0–37.5)
MCV (HT): 94 fL — NL (ref 80–100)
Mean Corpuscular Hemoglobin (MCH) (HT): 31.3 pg — NL (ref 26.0–34.0)
Platelets (HT): 224 10 3/uL — NL (ref 150–450)
RBC (HT): 5.49 10 6/uL — NL (ref 4.40–6.20)
RDW (HT): 12.7 % — NL (ref 0.0–15.2)
WBC (HT): 5.9 10 3/uL — NL (ref 4.0–11.0)

## 2019-01-04 NOTE — Progress Notes (Signed)
[facility]  OT Daily Note    Name: Alejandro Dennis  DOB: August 01, 1955  Referring Physician: Caroline More, MD  Todays Date: 01/04/2019  Visit #: 3  Return to Physician Date: 01/26/19    Diagnosis:     ICD-10-CM ICD-9-CM   1. Closed dislocation of metacarpophalangeal joint of left thumb, subsequent encounter  S63.115D V58.89       Subjective:  Patient experiences occasional achy pain at 1/10 when he overstresses thumb a bit to regain movement. He notes he feels that his thumb is getting a little better. He notes rubbing his volar thumb on jeans and shirt and other materials has helped reduce hypersensitivity.     Objective:    Treatment: Details   THERAPEUTIC EXERCISE HEP review for MH, retrograde massage, scar massage (volar abrasion area), thumb and wrist AROM all planes. Upgraded for gentle theraputty ex. Handouts provided.    AROM Thumb AROM all planes. Wrist AROM.    Resistive ex.  Theraputty - yellow - full grip and flat tip grip with no thumb use, gentle thumb pinch ex.        MANUAL Assessment for progress     Gentle PROM and stretching by therapist.        NEUROMUSCULAR RE-ED            THERAPEUTIC ACTIVITY            SELF-CARE MANAGEMENT        MODALITIES Ultrasound: 3.3 mHz, 0.8 w/cm2 continuous with gentle flexion stretch           OTHER          Comment: N/A     Education:  Updated HEP, Verbal cues for ther ex, Manual cues for ther ex     Edema Measurements (cm) Right Left Left    12/21/18 12/21/18 01/04/19   Thumb MP  8.9 cm  10.5 cm  10.1 cm                AROM AROM     Right Left Left     12/21/18 12/21/18 01/04/19   THUMB MP Extension/Flexion 52 28 40    IP Extension/Flexion 75 52 58    Radial Abduction 40 39 41    Palmar Abduction 45 37 41    Opposition WNL Tip 5      Thumb to base of Little Finger WNL 3.5 cm         Assessment:  Assessed AROM and patient demonstrates improvement in all planes of left thumb. Edema has reduced at thumb MP  joint although persists at mild to moderate level in comparison to opposite side. Patient has overall discontinued orthotic use, thumb MP remains very stable. Initiated treatment to further assist with reducing tissue tightness for improved AROM. Implemented gentle hand strengthening ex excluding thumb use, added gentle thumb pinch exercise which was tolerated well. Patient is progressing well toward therapy goals.        Plan of Care: Continue with progression of treatment to be guided by patient response. Patient is having a cervical spine surgery on 12/21 and therefore, we are scheduled to follow up in 2 weeks     Thank you for referring this patient to [facility].    Reeves Forth, OT,CHT        OT Time  Reporting    Service-Based Procedures/ Modalities    Evaluation (high, moderate, low)    Re-evaluation  Electric stimulation (unattended)    Paraffin Bath    Fluidotherapy ([employer])    Total service-based billable procedures        Time-Based  Procedures / Modalities    Manual Therapy 18   Massage    Neuromuscular Re-Ed    Self-Care Home Management    Therapeutic Activities    Therapeutic Exercise 14   Electric Stimulation (manual)    Iontophoresis    Orthotic Fit/Train Initial Encounter    Orthotic Fit/Train Subsequent Enc    Ultrasound 8   Total Time-Based Billable Procedures 40       Total Treatment Time 40

## 2019-01-18 ENCOUNTER — Ambulatory Visit

## 2019-01-18 DIAGNOSIS — S63115D Dislocation of metacarpophalangeal joint of left thumb, subsequent encounter: Secondary | ICD-10-CM

## 2019-01-18 NOTE — Progress Notes (Signed)
[place]  OT Daily Note    Name: Alejandro Dennis  DOB: 1955/07/01  Referring Physician: Doylene Canard, MD  Todays Date: 01/18/2019  Visit #: 4  Return to Physician Date: 01/26/19    Diagnosis:     ICD-10-CM ICD-9-CM   1. Closed dislocation of metacarpophalangeal joint of left thumb, subsequent encounter  S63.115D V58.89       Subjective:  Patient reports that his thumb is overall doing well, he notes that his thumb occasionally gives him reminders when he does too much. He notes that his range of motion is better, he notes he feels he still has swelling around thumb MP joint. He is able to lift cups with left hand, buttoning.     Objective:    Treatment: Details   THERAPEUTIC EXERCISE HEP review for MH, retrograde massage, scar massage (volar abrasion area), thumb and wrist AROM all planes. Reviewed gentle theraputty ex. Handouts provided.    AROM Thumb AROM all planes. Wrist AROM.    Resistive ex.  Theraputty - yellow - full grip and flat tip grip with no thumb use, gentle thumb pinch ex - upgraded resistance to red and performed exercises        MANUAL Assessment for progress     Gentle PROM and stretching by therapist.        NEUROMUSCULAR RE-ED            THERAPEUTIC ACTIVITY Golf - golf swings in clinic off mat - hit golf balls half swing for return to golf            SELF-CARE MANAGEMENT        MODALITIES Ultrasound: 3.3 mHz, 0.8 w/cm2 continuous with gentle flexion stretch           OTHER          Comment: N/A     Education:  Updated HEP, Verbal cues for ther ex, Manual cues for ther ex     Edema Measurements (cm) Right Left Left    12/21/18 12/21/18 01/04/19   Thumb MP  8.9 cm  10.5 cm  10.1 cm                AROM AROM AROM     Right Left Left Left     12/21/18 12/21/18 01/04/19 01/18/19   THUMB MP Extension/Flexion 52 28 40 45    IP Extension/Flexion 75 52 58 61    Radial Abduction 40 39 41 41    Palmar Abduction 45 37 41 43    Opposition WNL Tip  5   WNL    Thumb to base of Little Finger WNL 3.5 cm   2 cm        Position Right Left Left    12/21/18 12/21/18 01/18/19   1      2  85 62 77   3        Pinch (lbs) Right Left Left    12/21/18 12/21/18 01/18/19   Lateral 25 15 17    Tip (2 point)      Tip (3 point)          Assessment:  Patient is now 10.5 weeks post onset of injury. He continues to make progress with return of AROM of left thumb, improvements noted in left grip and fine pinch strength. Upgraded resistance of theraputty for further hand strengthening. Performed trial of golf swings in clinic to assess tolerance at left thumb MP joint. Patient performed approximately  15 swings with no complaint of pain at MP joint.        Plan of Care: Continue with progression of treatment to be guided by patient response.     Thank you for referring this patient to [facility].    Reeves Forth, OT,CHT        OT Time  Reporting    Service-Based Procedures/ Modalities    Evaluation (high, moderate, low)    Re-evaluation    Electric stimulation (unattended)    Paraffin Bath    Fluidotherapy ([place])    Total service-based billable procedures        Time-Based  Procedures / Modalities    Manual Therapy 10   Massage    Neuromuscular Re-Ed    Self-Care Home Management    Therapeutic Activities 10   Therapeutic Exercise 12   Electric Stimulation (manual)    Iontophoresis    Orthotic Fit/Train Initial Encounter    Orthotic Fit/Train Subsequent Enc    Ultrasound 8   Total Time-Based Billable Procedures 40       Total Treatment Time 40

## 2019-01-22 HISTORY — PX: NECK SURGERY: SHX720

## 2019-01-24 ENCOUNTER — Other Ambulatory Visit: Payer: Self-pay | Admitting: Cardiology

## 2019-01-24 DIAGNOSIS — E785 Hyperlipidemia, unspecified: Secondary | ICD-10-CM

## 2019-01-26 ENCOUNTER — Ambulatory Visit: Attending: Orthopedic Surgery

## 2019-01-26 ENCOUNTER — Encounter: Payer: Self-pay | Admitting: Orthopedic Surgery

## 2019-01-26 ENCOUNTER — Ambulatory Visit: Admitting: Orthopedic Surgery

## 2019-01-26 VITALS — BP 121/85 | Ht 68.0 in | Wt 167.0 lb

## 2019-01-26 DIAGNOSIS — S63269A Dislocation of metacarpophalangeal joint of unspecified finger, initial encounter: Secondary | ICD-10-CM

## 2019-01-26 DIAGNOSIS — S63115D Dislocation of metacarpophalangeal joint of left thumb, subsequent encounter: Secondary | ICD-10-CM | POA: Insufficient documentation

## 2019-01-26 NOTE — Progress Notes (Signed)
Orthopedic Surgery Progress Note    CC: Left thumb MCP joint dislocation    History: Alejandro Dennis is a 64 y.o. male who presents for follow-up of left thumb MCP joint dislocation.  The injury occurred nearly 3 months ago.  He is going to hand therapy.  He reports improved strength of the hand and that he can do almost all activities with occasional discomfort at the MCP joint.    Exam:  Constitutional: No acute distress, alert, oriented  MSK:  left upper extremity   Skin intact, no signs of infection  Warm and well perfused  Brisk capillary refill throughout  Baseline motor and sensory exams  Patient is able to make a composite fist  Minimal if any tenderness about the thumb MCP joint  Thumb MCP and IP motion overall well-maintained with ability to oppose to the PIP joint of the small finger  Stress testing of the MCP joint of the thumb is stable and very similar to contralateral right thumb    Assessment: Alejandro Dennis is a 64 y.o. male with left thumb MCP joint dislocation    Plan: The patient is doing very well nearly 3 months out from a left thumb MCP joint dislocation.  He has overall excellent motion and stability and physical exam.  He is doing well with therapy and he has a few additional visits with them.  I counseled him that overall the joint is stable and likely requires no further treatment from me.  However if the joint does become problematic in the future option exist for ligament reconstruction versus arthrodesis and he understands this.  I counseled him that there will likely always be a slight bulbous nature to the joint due to the injury and he understands this.  He may use the hand to his tolerance.  Questions elicited and answered.  He may follow-up as needed.    Return if symptoms worsen or fail to improve.    Pennie Banter. Romeo Apple, MD  Assistant Professor of Clinical Orthopaedics  Division of Hand, Wrist, & Upper Extremity  [place] [facility]   Office: [phone]    This note  was dictated using speech recognition software.  A thorough attempt was made to proof read and correct any errors.

## 2019-01-26 NOTE — Progress Notes (Signed)
[facility]  OT Daily Note    Name: Alejandro Dennis  DOB: 1955-09-17  Referring Physician: Caroline More, MD  Todays Date: 01/26/2019  Visit #: 5  Return to Physician Date: 01/26/19    Diagnosis:     ICD-10-CM ICD-9-CM   1. Closed dislocation of metacarpophalangeal joint of left thumb, subsequent encounter  S63.115D V58.89     Subjective:  Patient reports that his thumb is overall doing well, he notes that his thumb occasionally gives him reminders when he does too much. He notes that his range of motion is better, he notes he feels he still has swelling around thumb MP joint. He is able to lift cups with left hand, buttoning.     Objective:    Treatment: Details   THERAPEUTIC EXERCISE HEP review for MH, retrograde massage, scar massage (volar abrasion area), thumb and wrist AROM all planes. Reviewed gentle theraputty ex. Handouts provided.    AROM Thumb AROM all planes. Wrist AROM.    Resistive ex.  Theraputty - full grip and flat tip grip with no thumb use, gentle thumb pinch ex - upgraded resistance to green and performed exercises.        MANUAL Assessment for progress     Gentle PROM and stretching by therapist.        NEUROMUSCULAR RE-ED            THERAPEUTIC ACTIVITY            SELF-CARE MANAGEMENT        MODALITIES Ultrasound: 3.3 mHz, 0.8 w/cm2 continuous with gentle flexion stretch           OTHER          Comment: N/A     Education:  Updated HEP, Verbal cues for ther ex, Manual cues for ther ex     Edema Measurements (cm) Right Left Left    12/21/18 12/21/18 01/04/19   Thumb MP  8.9 cm  10.5 cm  10.1 cm                AROM AROM AROM     Right Left Left Left     12/21/18 12/21/18 01/04/19 01/18/19   THUMB MP Extension/Flexion 52 28 40 45    IP Extension/Flexion 75 52 58 61    Radial Abduction 40 39 41 41    Palmar Abduction 45 37 41 43    Opposition WNL Tip 5   WNL    Thumb to base of Little Finger WNL 3.5 cm   2 cm        Position Right Left Left  Left    12/21/18 12/21/18 01/18/19 01/25/18   1       2  85 62 77 86   3         Pinch (lbs) Right Left Left Left    12/21/18 12/21/18 01/18/19 01/25/18   Lateral 25 15 17 19    Tip (2 point)       Tip (3 point)           Assessment:  Assessed thumb AROM upon arrival to session and after ultrasound/stretching. Further improvements are noted following stretching indicating stiffness is expected to continue to reduce. Assessed hand strength today, patient demonstrates return of left grip strength to WNL. Mild deficit of left pinch strength persists at this time. Therefore, upgraded resistance of theraputty for further improvement to strength of left hand overall. Patient will now follow up in 2  weeks with continued use of HEP at this time.        Plan of Care: Continue with progression of treatment to be guided by patient response.     Thank you for referring this patient to [place].    Cordelia Pen, OT,CHT        OT Time  Reporting    Service-Based Procedures/ Modalities    Evaluation (high, moderate, low)    Re-evaluation    Electric stimulation (unattended)    Paraffin Bath    Fluidotherapy ([employer])    Total service-based billable procedures        Time-Based  Procedures / Modalities    Manual Therapy 12   Massage    Neuromuscular Re-Ed    Self-Care Home Management    Therapeutic Activities 12   Therapeutic Exercise    Electric Stimulation (manual)    Iontophoresis    Orthotic Fit/Train Initial Encounter    Orthotic Fit/Train Subsequent Enc    Ultrasound 8   Total Time-Based Billable Procedures 32       Total Treatment Time 32

## 2019-02-09 ENCOUNTER — Ambulatory Visit

## 2019-02-09 DIAGNOSIS — S63115D Dislocation of metacarpophalangeal joint of left thumb, subsequent encounter: Secondary | ICD-10-CM

## 2019-02-09 NOTE — Progress Notes (Signed)
Sent via: eRecord EMR    Physician attestation for Plan of Care: Physician: Dr. Romeo Apple   Per signature, I have reviewed and agree with the documented plan of care.    Signature: ___________________________________________________  ____________________________________________________________  Plan of Care     The physician's co-signature on this note indicates that they have reviewed this evaluation and agree with the documented goals and plan of care.       [facility]  OT Discharge Summary     Name: Alejandro Dennis  DOB: October 23, 1955  Referring Physician: Caroline More, MD  Todays Date: 02/09/2019  Reporting period: 12/21/18-02/09/19  Visit #: 6  Return to Physician Date: Discharged from care     Diagnosis:     ICD-10-CM ICD-9-CM   1. Closed dislocation of metacarpophalangeal joint of left thumb, subsequent encounter  S63.115D V58.89     Subjective     Alejandro Dennis is a 64 y.o. male who has now received 6 treatment session following L thumb MCP dislocation. Patient has now fully weaned from all protective orthotics, hand based thumb spica orthosis worn initially to stabilize involved MCP joint. Intervention focus has been on edema reduction, maximizing AROM and strength for functional return. Patient has returned to nearly full functional use of left hand. He was able to grasp and swing a golf club in clinic with no discomfort or difficulty, this was a primary goal for him.      Attendance:  Good    Compliance:  Good   Relevant symptoms: Aching, Decreased ROM, Decreased strength  Symptom intensity (0 - 10 scale): Now 0 Best 0 Worst 1  Pain Details: Patient denies pain in left thumb for majority of time. He occasionally experiences achy pain in left thumb with active use, 1/10 intensity.   Symptoms Details: Patient denies paresthesia.    Objective:  Observation/Palpation: Unremarkable      Edema Measurements (cm) Right Left Left Left    12/21/18 12/21/18 01/04/19 02/09/19      Thumb MP 8.9 cm  10.5 cm 10.1 cm  9.6 cm                          AROM AROM AROM AROM     Right Left Left Left Left     12/21/18 12/21/18 01/04/19 01/18/19 02/09/19   THUMB MP Extension/Flexion 52 28 40 45 50    IP Extension/Flexion 75 52 58 61 62    Radial Abduction 40 39 41 41 41    Palmar Abduction 45 37 41 43 42    Opposition WNL Tip 5  WNL WNL    Thumb to base of Little Finger WNL 3.5 cm  2 cm  0.5 cm lag        Position Right Left Left Left Left    12/21/18 12/21/18 01/18/19 01/25/18 02/09/19   1        2  85 62 77 86 88   3          Pinch (lbs) Right Left Left Left Left    12/21/18 12/21/18 01/18/19 01/25/18 02/09/19   Lateral 25 15 17 19 20    Tip (2 point)        Tip (3 point)          Functional Outcome Measure  DASH Score:    12/21/18 21.6      02/09/19 0    Assessment:   Findings consistent with 64 y.o. male  who is now 13.5 weeks post onset of left MCP joint dislocation. Patient has weaned from all use of protective orthotics. Patient denies pain in left thumb for majority of time. He occasionally experiences achy pain in left thumb with active use, 1/10 intensity. Patient denies paresthesia of left hand and thumb. Edema has reduced at left thumb MP joint. Mild enlargement of joint persists in which patient was instructed that this is likely to be long term. He shows further improvement of left thumb AROM, nearly WNL at this time. L grip strength has returned to WNL, fine pinch strength is also nearly WNL with very minimal limitation. Patient has a thorough HEP for continued use to regain maximum strength. Patient has returned to full functional use of left hand. Therefore, he is appropriate for discharge from formal hand therapy in clinic with recommendation to continue HEP as instructed.     Prognosis:  Good    Contraindications/Precautions/Limitation:  None at this time     Goal Length Status   Patient will demonstrate independence in home exercise program 2 weeks   Achieved    Patient will reduce pain intensity of left thumb to 0-1/10 with active use 6 weeks Achieved    Patient will reduce edema at left thumb MP level by 0.4-0.6 cm per circumferential measurements  4 weeks Achieved    Patient will imporve L wrist and thumb AROM to within 5 deg of opposite hand to return ability to donn clothing and shower with good use of left hand  6 weeks Achieved    Patient will improve L grip strength to WNL in comparison to opposite side to return ability to grasp and lift items while cooking  6 weeks Achieved    Patient will improve L thumb pinch strengths by 5-8 lbs for improved ability to perform indoor and outdoor home management tasks  8 weeks Achieved    Patient will improve L thumb activity tolerance as demonstrated by improved ability to play golf  8 weeks Achieved         Treatment Plan:   Patient/family involved in developing goals and treatment plan: Yes    Patient is discharged from formal hand therapy in clinic with recommendation to continue HEP as instructed     Treatment today consisted of:    Treatment: Details   THERAPEUTIC EXERCISE HEP review for MH, retrograde massage, scar massage (volar abrasion area), thumb and wrist AROM all planes. Reviewed gentle theraputty ex. Handouts provided.Reviewed entire HEP for continued completion post discharge.    AROM Thumb AROM all planes.            MANUAL Assessment for progress     Gentle PROM and stretching by therapist.        NEUROMUSCULAR RE-ED            THERAPEUTIC ACTIVITY            SELF-CARE MANAGEMENT        MODALITIES            OTHER             Thank you for the referral of this patient to [facility]    Reeves Forth, OT,CHT                OT Time  Reporting    Service-Based Procedures/ Modalities    Evaluation (high, moderate, low)    Re-evaluation    Electric stimulation (unattended)    Paraffin [place]  Fluidotherapy ([employer])    Total service-based  billable procedures        Time-Based  Procedures / Modalities    Manual Therapy 18   Massage    Neuromuscular Re-Ed    Self-Care Home Management    Therapeutic Activities    Therapeutic Exercise 10   Electric Stimulation (manual)    Iontophoresis    Orthotic Fit/Train Initial Encounter    Orthotic Fit/Train Subsequent Enc    Ultrasound    Total Time-Based Billable Procedures 28       Total Treatment Time 28

## 2019-02-22 DIAGNOSIS — M50122 Cervical disc disorder at C5-C6 level with radiculopathy: Secondary | ICD-10-CM

## 2019-02-22 HISTORY — DX: Cervical disc disorder at C5-C6 level with radiculopathy: M50.122

## 2019-03-19 ENCOUNTER — Encounter: Payer: Self-pay | Admitting: Gastroenterology

## 2019-04-01 ENCOUNTER — Ambulatory Visit: Attending: Nurse Practitioner | Admitting: Rehabilitative and Restorative Service Providers"

## 2019-04-01 DIAGNOSIS — M50122 Cervical disc disorder at C5-C6 level with radiculopathy: Secondary | ICD-10-CM | POA: Insufficient documentation

## 2019-04-01 DIAGNOSIS — M6281 Muscle weakness (generalized): Secondary | ICD-10-CM | POA: Insufficient documentation

## 2019-04-01 NOTE — Progress Notes (Cosign Needed)
Sent via: Fax    Physician attestation for Plan of Care: Physician:  Dr.Maxwell  Per signature, I have reviewed and agree with the documented plan of care.    Signature: ___________________________________________________  ____________________________________________________________  Plan of Care     The [id number] co-signature on this note indicates that they have reviewed this evaluation and agree with the documented goals and plan of care.            [place]  Cervical Spine Initial Evaluation    Name: Alejandro Dennis  DOB: 11/05/1955  Referring Physician: Derald Macleod, NP  Todays Date: 04/01/2019  Visit #: 1  Diagnosis:     ICD-10-CM ICD-9-CM   1. Muscle weakness of right arm  M62.81 728.87   2. Cervical disc disorder at C5-C6 level with radiculopathy  M50.122 722.91     723.4       Subjective  Alejandro Dennis is a 64 y.o. male who is present today for Neck pain:  right and Arm pain:  right care. Prior medical notes indicate he reported sudden onset R neck and arm pain starting approximately 6 weeks before seeing Dr. Kirkland Hun office 10/27/2019. The patient describes preop pain in the R biceps and triceps, tingling into the R hand thumb and index finger, and weakness in elbow flexion and overhead activity. He underwent cervical C5/C6 laminotomy and foraminotomy with decompression of C6 nerve root 02/02/2019. The numbness and tingling in the arm and pain improved following the surgery. He reports persistent R arm weakness especially in his elbow flexors; it is improving slowly. His baseline strength prior to surgery was reported by patient 30 lbs 12 reps. He reports he can't lift 5 lbs more than 10 times now.  Current complaints:  1. He is post op 8 weeks and is reporting daily functional deficits including drying off after shower, lifting items, shoulder ROM is affected.  2. Neck and thoracic spine stiffness no pain. Arm pain denied.   3. N/T denied. Improved since  surgery  Onset date:  6 weeks before 10/27/2019, Acute   Date of surgery: 02/02/2019    History  Mechanism of injury/history of symptoms:  No specific cause and unknown  Prior therapy: not for neck and arm weakness  Previous providers seen: neurosurgeon  Diagnostic tests: MRI  Symptoms worsen with: Reaching overhead, Reaching to the side, Lifting, Carrying, Gripping, Use of R arm  Symptoms improve with: Rest  Relevant symptoms:  Decreased ROM, Decreased strength  Symptom location: Peripheral, right  Symptom frequency: Persistent  Pain level (0 - 10 scale): pain denied. Persistent neck stiffness and weakness R arm  Night Pain: No   Restful sleep:   Yes  Morning Pain/Stiffness: None   Relevant Comorbidities/Personal Factors:   Past Medical History:   Diagnosis Date    Carotid artery stenosis, asymptomatic, right     HLD (hyperlipidemia)      These are relevant factors as they: may affect tissue healing response and may affect muscle function, thus impacting treatment outcome and plan of care.   Surgical history:    Past Surgical History:   Procedure Laterality Date    Carotid endarterectomy Right 07/19/2015    INGUINAL HERNIA REPAIR Right 2010     Current Medications: medications discussed with patient.    Occupation and Activities  Work status: full time Tour manager, Psychologist, [employer]   Stresses/physical demands of job: 60%/40% active on site and desk work  Sport(s):  jogging, fitness, golf, [employer]  Living/Home Environment: Lives with spouse, Multi level home and Stairs to enter  Stresses/physical demands of home: Self Care, Housekeeping and Gardening/Yard Work  Prior level of function: Pt reports no limitations with performance of ADLs or functional activities prior to surgery, but was performing all activities with pain. Also had weakness in R arm prior to surgery  Assistive device:  none  Functional: Prepare a meal mild limitation  Place an object overhead mild limittaion  Do heavy household  chores mild limitation  Yardwork mild limitation  Make a bed mild limitation  Carry a shopping bag or biefcase moderate limitation  Carry a heavy object >10 lbs moderate limitation  Change a lightbulb mild limitation  Wash or blow dry hair moderate limitation  Wash your back moderate limitation  Put on sweater mild limitation  Recreational activities moderate limitation  Pt stated goals: restore mobility neck and arm, return strength R arm, return to full presurgery level  Outcome: DASH 28.3% perceived disability     Objective  Blood pressure: 126/86 mmHG  Heart Rate: 86 bpm  SpO2: 98%  Posture:  guarded posture and motions, slightly forward head.  Incision:   Healed surgical incisions    ROM  Cervical Spine AROM   Flexion 20% loss   Extension 25% loss   Right Sidebending 75-80% loss   Left Sidebending 75-80% loss   Right Rotation 65 degrees   Left Rotation 55 degrees     Cervical Spine Special Testing:  deferred s/p cervical laminotomy     Thoracic Spine AROM   Flexion 20% loss   Extension 50% loss   Right Rotation full   Left Rotation 25% loss   Seated thoracic extension over chair, improved right rotation    Shoulder AROM: not tested today. Will attempt to assess next visit.    Neurological:   Myotomes   Right Left   C1, C2 - Neck flexion NT NT   C2, 3, 4 - Upper trap 5/5 5/5   C5 - Deltoid NT NT   C6 - Bicep/Wrist Extension See below See below   C8 - Thumb extension NT NT   T1 - Interossei NT NT     Dermatomes WNL to gross light touch R and L UE dermatomes  Reflexes: reduced reflex R arm   Right Left   C5-Bicep 1+ 2+   C6-Brachial Radialis 1+ 2+   C7-Tricep 1+ 2+     Strength: C6 innervated muscles, not all  Muscles  measured in supine Right Left   Biceps Brachii 35 lbs 70 lbs   Brachioradialis 35.7 lbs 67.2 lbs   Triceps 30.9 lbs 48 lbs   Infraspinatus/  Teres Minor  (ER @neutral ) 12.9 lbs 35.2 lbs   Subscapularis (IR @ neutral) 33 lbs 56.3 lbs   Pec major  (Add/IR @90  flex) 17.2 lbs 28.2 lbs   Wrist Extension  (elbow flex @90 ) 19.6 lbs 22.5 lbs   Wrist flexion (elbow flex @90 ) 19 lbs 32 lbs      Will assess next visits:  Supraspinatus  Brachialis  Deltoid  Serratus Anterior  Coracobrachialis  Teres Major  Supinator  Latissimus Dorsi    Assessment:  Findings consistent with 64 y.o., male s/p cervical laminotomy and foraminotomy C5/6 with decompression of C6 nerve root with reduced strength R arm, in particular muscles innervated by C6 nerve roots; reduced ROM due to weakness in muscles; and definite reduction in daily function of the R arm, below his baseline premorbid function. The patient is  a good candidate for physical therapy and would benefit from skilled PT intervention in order to address the current impairments and return to prior level of function.  A history with 1-2 personal factors and/or comorbidities that impact the plan of care.  An examination of body system(s) using standardized test and measures addressing 3-4 elements from any of the following: body structures and functions, activity limitations and/or participation restrictions.  A clinical presentation with evolving/changing characteristics.   A Moderate Complexity evaluation was performed today.  Prognosis:  Good   Contraindications/Precautions/Limitation:  Per diagnosis  Goal Length Status   Pt will demonstrate I and compliance with current HEP in 4 weeks. Short Term New   Pt will demonstrate good understanding of post op precautions and activity modifications recommended by PT in 4 weeks. Short Term New   Pt will demonstrate improved cervical AROM in order to be able to turn their head to look over their shoulder while driving in 4 weeks. Short Term New   Pt with improved strength measures allowing for improved overhead lifting Long Term New   Pt with improved strength R elbow flexors able to lift 15 lbs 10 reps Long Term New   Improved DASH score by 10% Long Term New     Treatment Plan:   Anticipated plan of care reviewed with patient and/or  family:  Yes   Frequency will be determined by patient availability. He is going out of town next week.  Freq 1-2 times per week for 8 week(s)  Treatment plan inclusive of:   Exercise: AROM, AAROM, PROM, Stretching, Strengthening, Progressive Resistive, Aerobic exercise   Manual Techniques:  prn   Modalities: prn   Aquatic therapy: prn   Neuro Re-ed: Balance, Coordination and Posture   Functional: Proprioception/Dynamic stability, Sports specific, Postural training   CPTs: Therapeutic Exercise (97110), Therapeutic Activity (97530), Neuromuscular Red-ed (93810) and Manual Therapy (97140)  Treatment today consisted of:  Treatment: Details   THERAPEUTIC EXERCISE    Seated thoracic extension over chair 10 reps, 5 secs each  Compared thoracic rotation pre/post   Seated cervical rotation R and L Discussed and demonstrated ROM       MANUAL            NEUROMUSCULAR RE-ED            THERAPEUTIC ACTIVITY            GAIT            MODALITIES            OTHER    Lengthy discussion and instruction regarding anatomy of nerve root innervation and muscle function. Discussed continued work on elbow flexors, shoulder ER/IR/elevation, and forearm/wrist function      Thank you for referring this patient to [facility].    Abner Greenspan, PT, MPT         Time Reporting Minutes   Service-Based Procedures/ Modalities    Evaluation (high, moderate, low) Moderate   Re-evaluation    Traction, mechanical    Electric stimulation (unattended)    Total service-based billable procedures 37 mins       Time-Based  Procedures / Modalities    Therapeutic ex 8 mins   Neuromuscular Re-ed    Manual Therapy    Therapeutic Activities    Gait training, including stairs    Ultrasound    Electrical Stimulation    Iontophoresis    Physical Performance Test    Aquatic Therapy  Total Time-Based Billable Procedures 8 mins       Total Skilled Treatment Time 45 mins

## 2019-04-15 ENCOUNTER — Ambulatory Visit: Admitting: Rehabilitative and Restorative Service Providers"

## 2019-04-22 ENCOUNTER — Ambulatory Visit: Attending: Nurse Practitioner

## 2019-04-22 DIAGNOSIS — M6281 Muscle weakness (generalized): Secondary | ICD-10-CM | POA: Insufficient documentation

## 2019-04-22 DIAGNOSIS — M50122 Cervical disc disorder at C5-C6 level with radiculopathy: Secondary | ICD-10-CM | POA: Insufficient documentation

## 2019-04-22 NOTE — Progress Notes (Signed)
[facility]  Daily Note    Name: Alejandro Dennis  DOB: [birth year]  Referring Physician: Ladell Heads, NP  Todays Date: 04/22/2019  Visit #: 2    Diagnosis:     ICD-10-CM ICD-9-CM   1. Muscle weakness of right arm  M62.81 728.87   2. Cervical disc disorder at C5-C6 level with radiculopathy  M50.122 722.91     723.4          Subjective:  Pt reports no significant changes in pain or function since previous treatment session. Denies adverse response following initial evaluation.  Patient reports that he is trying to get back to jogging - states he was able to do 3 miles the other day.  "It will be interesting to see how my golf game goes."      Objective:     Treatment: Details   THERAPEUTIC EXERCISE    UBE - seat at 3  Retro only for posture, manual level 1, 5'    Seated Thoracic Extension - chair  10 x 5" hold    Seated Thoracic Rotation - chair  10 x 5" hold each (first 5 with head following, then head in neutral)    Elbow Flexion / Extension  5# 3 x 8    Wrist Flexion / Extension - over foam roll 3# 2 x 12     Seated Cervical Rotation  R/L x 10 each    Seated Cervical Retraction  10 x 5" hold    R Tband ER - green  Towel at elbow, 2 x 8    R Tband IR - green  Towel at elbow, 2 x 10    B Flexion Wall Slide - tension in towel  12 x        Education:  Updated HEP, Verbal cues for ther ex, Independent symptom management techniques    Assessment:  Patient demonstrates excellent tolerance to initial treatment session without adverse response.  Patient fatigues easily with R tband ER and bicep curl - reps provided between set to allow for muscle recovery.  Patient voiced HEP understanding.  Patient will continue to benefit from skilled PT interventions with focus on continued postural training and strengthening to R UE as appropriate per patient response to improve tolerance and endurance for ADL's and functional activities.         Plan of Care: Continue with progression of  treatment to be guided by patient response.    Thank you for referring this patient to [facility].    Ebbie Ridge, ATC,PTA       Time Reporting Minutes   Service-Based Procedures/ Modalities    Evaluation (high, moderate, low)    Re-evaluation    Traction, mechanical    Electric stimulation (unattended)    Total service-based billable procedures        Time-Based  Procedures / Modalities    Therapeutic ex 40   Neuromuscular Re-ed    Manual Therapy    Therapeutic Activities    Gait training, including stairs    Ultrasound    Electrical Stimulation    Iontophoresis    Physical Performance Test    Aquatic Therapy    Total Time-Based Billable Procedures 40       Total Skilled Treatment Time 40

## 2019-04-26 ENCOUNTER — Ambulatory Visit: Admitting: Rehabilitative and Restorative Service Providers"

## 2019-04-26 DIAGNOSIS — M50122 Cervical disc disorder at C5-C6 level with radiculopathy: Secondary | ICD-10-CM

## 2019-04-26 DIAGNOSIS — M6281 Muscle weakness (generalized): Secondary | ICD-10-CM

## 2019-04-26 NOTE — Progress Notes (Signed)
[facility]  Daily Note    Name: Alejandro Dennis  DOB: 1955/12/17  Referring Physician: Ladell Heads, NP  Todays Date: 04/26/2019  Visit #: 3    Diagnosis:     ICD-10-CM ICD-9-CM   1. Muscle weakness of right arm  M62.81 728.87   2. Cervical disc disorder at C5-C6 level with radiculopathy  M50.122 722.91     723.4          Subjective:  Pt reports no significant changes in pain or function since previous treatment session. Denies adverse response following last treatment. Reports he is able to tolerate 3 x 8 reps of 5 lbs bicep curls better. He notes difficulty with overhead reaching and with doing any type of pull up. He is considering playing golf tomorrow; he has no restrictions per his account.    Objective:     Treatment: Details   THERAPEUTIC EXERCISE    UBE - seat at 3  Retro only for posture, manual level 2, 3'    1/2 kneeling thoracic rotation R/L 5 reps with head and shoulder moving together, 5 reps with head stationary, shoulders moving   Elbow Flexion with supination  Elbow Flexion hammer curls 7# 2 x 10   7# 6 reps, 3# 2 x 12 reps   Wrist Flexion / Extension - over foam roll 4# 2 x 10     Seated Cervical Rotation  R/L x 10 each    Seated Cervical Retraction  10 x 5" hold    R Tband ER - green  Towel at elbow, 2 x 10   R Tband IR - green  Towel at elbow, 2 x 10    Ball on wall CKC scap stab  CW/CCW 20 reps    B wall slides flexion - tension in towel 10 reps   B wall push up 10 reps   B hands on wall scap prot/retract 10 reps, vc's   1/2 wall overhead raises 3# 2 x 10 reps       Education:  Updated HEP, Verbal cues for ther ex, Independent symptom management techniques    Assessment:  Patient demonstrates slight gain in strength of biceps brachii, but notes same weakness in coracobrachialis (hammer curls) and RC. He is scheduled for this week, then he leaves for out of town for three weeks. We discussed the prognosis and time it takes for nerve to recover; he  demonstrated understanding. Patient will continue to benefit from skilled PT interventions with focus on continued postural training and strengthening to R UE as appropriate per patient response to improve tolerance and endurance for ADL's and functional activities.         Plan of Care: Continue with progression of treatment to be guided by patient response.    Thank you for referring this patient to [facility].    Abner Greenspan, PT, MPT       Time Reporting Minutes   Service-Based Procedures/ Modalities    Evaluation (high, moderate, low)    Re-evaluation    Traction, mechanical    Electric stimulation (unattended)    Total service-based billable procedures        Time-Based  Procedures / Modalities    Therapeutic ex 40   Neuromuscular Re-ed    Manual Therapy    Therapeutic Activities    Gait training, including stairs    Ultrasound    Electrical Stimulation    Iontophoresis    Physical Performance  Test    Aquatic Therapy    Total Time-Based Billable Procedures 40       Total Skilled Treatment Time 40

## 2019-04-28 NOTE — Progress Notes (Signed)
[facility]  Daily Note    Name: Alejandro Dennis  DOB: [birth year]  Referring Physician: Ladell Heads, NP  Todays Date: 04/29/2019  Visit #: 4    Diagnosis:     ICD-10-CM ICD-9-CM   1. Muscle weakness of right arm  M62.81 728.87   2. Cervical disc disorder at C5-C6 level with radiculopathy  M50.122 722.91     723.4          Subjective:  Pt reports no significant changes in pain or function since previous treatment session. Reports that he wasn't able to golf yesterday - states that he hopes to golf today however he did get another work request.  Patient plans to leave for [facility] tomorrow - returning on 05/29/19.      Objective:     Treatment: Details   THERAPEUTIC EXERCISE    UBE - seat at 3  Retro only for posture, manual level 2, 5'    1/2 kneeling thoracic rotation R/L 10 reps with head and shoulder moving together, 10 reps with head stationary, shoulders moving   Elbow Flexion with supination  Elbow Flexion hammer curls 7# 1 x 8, 1 x 6   7# 2 x 10    Wrist Flexion / Extension - over foam roll 4# 2 x 10     Seated Cervical Rotation  R/L x 10 each    Seated Cervical Retraction  10 x 5" hold    R Tband ER - green  Not performed Towel at elbow, 2 x 10   R Tband IR - green  Not performed Towel at elbow, 2 x 10    Ball on wall CKC scap stab  CW/CCW 20 reps    B wall slides flexion - tension in towel 10 reps   B wall push up 15 reps   B hands on wall scap prot/retract 10 reps, vc's   1/2 wall overhead raises 3# 2 x 10 reps   THERAPEUTIC ACTIVITIES    Golf swing  10-15 without ball,   10-15 with ball   Verbal cues for improved scapular depression and reduction of UT compensation.         Education:  Updated HEP, Verbal cues for ther ex, Independent symptom management techniques    Assessment:  Patient demonstrates good tolerance to treatment session without adverse response.  Slight improvement in tolerance to resisted elbow flexion however patient continues to fatigue easily  throughout session.  Trialed golf swing with fair tolerance, focus required to avoid pulling up - verbal cues following for decreased UT activation.  Patient likely guarding throughout swing.  Patient will continue to benefit from strengthening progressions as tolerated per patient response.         Plan of Care: Continue with progression of treatment to be guided by patient response. Patient to follow up when he returns from trip.      Thank you for referring this patient to [facility].    Ebbie Ridge, ATC,PTA       Time Reporting Minutes   Service-Based Procedures/ Modalities    Evaluation (high, moderate, low)    Re-evaluation    Traction, mechanical    Electric stimulation (unattended)    Total service-based billable procedures        Time-Based  Procedures / Modalities    Therapeutic ex 38   Neuromuscular Re-ed    Manual Therapy    Therapeutic Activities 4   Gait training, including stairs  Ultrasound    Electrical Stimulation    Iontophoresis    Physical Performance Test    Aquatic Therapy    Total Time-Based Billable Procedures 42       Total Skilled Treatment Time 42

## 2019-04-29 ENCOUNTER — Ambulatory Visit

## 2019-04-29 DIAGNOSIS — M50122 Cervical disc disorder at C5-C6 level with radiculopathy: Secondary | ICD-10-CM

## 2019-04-29 DIAGNOSIS — M6281 Muscle weakness (generalized): Secondary | ICD-10-CM

## 2019-05-05 ENCOUNTER — Encounter: Payer: Self-pay | Admitting: Physical Medicine and Rehabilitation

## 2019-05-31 ENCOUNTER — Ambulatory Visit

## 2019-06-02 ENCOUNTER — Ambulatory Visit: Admitting: Rehabilitative and Restorative Service Providers"

## 2019-06-09 ENCOUNTER — Ambulatory Visit: Attending: Nurse Practitioner | Admitting: Rehabilitative and Restorative Service Providers"

## 2019-06-09 DIAGNOSIS — M50122 Cervical disc disorder at C5-C6 level with radiculopathy: Secondary | ICD-10-CM | POA: Insufficient documentation

## 2019-06-09 DIAGNOSIS — M6281 Muscle weakness (generalized): Secondary | ICD-10-CM | POA: Insufficient documentation

## 2019-06-09 NOTE — Progress Notes (Cosign Needed)
Sent via: Fax    Physician attestation for Plan of Care: Physician:  Derald Macleod, Dr. Zigmund Daniel  Per signature, I have reviewed and agree with the documented plan of care.    Signature: ___________________________________________________  ____________________________________________________________  Plan of Care     The [id number]'R co-signature on this note indicates that they have reviewed this evaluation and agree with the documented goals and plan of care.            [place]  Upper Extremity Progress Note    Name: Alejandro Dennis  DOB: 1955-08-18  Referring Physician: Derald Macleod, NP  Todays Date: 06/09/2019  Visit #: 5  Reporting period: 04/01/2019 - 06/09/2019    Diagnosis:     ICD-10-CM ICD-9-CM   1. Muscle weakness of right arm  M62.81 728.87   2. Cervical disc disorder at C5-C6 level with radiculopathy  M50.122 722.91     723.4       Subjective     Alejandro Dennis is a 64 y.o. male who is present today for R arm weakness, cervical root disorder C5/6 care. The patient is s/p cervical C5/C6 laminotomy and foraminotomy with decompression of C6 nerve root 02/02/2019.    He has been seen 5 times over the past 10 weeks. He was away in [place] for the past 6-7 weeks. The patient reports he feels like he can lift things that he could not move before, but he was unable to do HEP as diligently as he would have liked. He reports golfing and driving do not seem to bother him. He reports some cramping in the biceps when he is sitting at home.  He spoke to Dr. Kirkland Hun office last week to give them an update of his progress.   PT has received care for 5 visits to date.  Attendance:  Fair   Compliance:  Fair   Symptom location: right arm (upper and lower)  Relevant symptoms: Decreased strength  Symptom frequency: Constant  Symptom intensity (0 - 10 scale): Now 0 Best 0 Worst 0. Pain is not issue, weakness persists     Objective:  Posture: improving postural awareness    ROM  /Strength:    Cervical ROM: rotation R stiffness at endrange, L WNL    Strength:   Muscles  measured in supine  HHD lbs Right  3/11 Left  3/11 Right  5/19   Biceps Brachii 35  70  45.2   Brachioradialis 35.7  67.2  47.4   Triceps 30.9  48  42.8   Infraspinatus/  Teres Minor  (ER '@neutral'$ ) 12.9  35.2  21.2   Subscapularis (IR @ neutral) 33 56.3  48.6   Pec major  (Add/IR '@90'$  flex) 17.2  28.2  28.8   Wrist Extension (elbow flex '@90'$ ) 19.6 22.5  23.2   Wrist flexion (elbow flex '@90'$ ) 19 32 32.5   Teres major NT NT 35.1  (L 40.1)   Latissimus NT NT 20.3  (L 32.1)   Deltoid NT NT  23.5  (L 45.0)       Assessment:  Findings consistent with 64 y.o. male s/p cervical C5/C6 laminotomy and foraminotomy with decompression of C6 nerve root 02/02/2019. Measurements taken during today's treatment indicate a measurable improvement in muscles that are innervated by the C6 nerve root including, but not exclusively, the elbow flexors, wrist extensors, and forearm supinators. He reports the numbness and tingling improved after the surgery, but the weakness (below his baseline) persists. He is being  more active and is noticing he can move and lift items more easily than at the start of PT. The patient is a good candidate for physical therapy and would benefit from skilled PT intervention in order to address the current impairments and return to prior level of function. Recommend 3-4 weeks follow ups with comprehensive HEP in place.    Prognosis:  Good    Contraindications/Precautions/Limitation:  Per diagnosis  Goal Length Status   Pt will demonstrate I and compliance with current HEP in 4 weeks. Short Term HEP, could be more compliant   Pt will demonstrate good understanding of post op precautions and activity modifications recommended by PT in 4 weeks. Short Term HEP   Pt will demonstrate improved cervical AROM in order to be able to turn their head to look over their shoulder while driving in 4 weeks. Short Term MET   Pt with improved  strength measures allowing for improved overhead lifting Long Term In progress   Pt with improved strength R elbow flexors able to lift 15 lbs 10 reps Long Term In progress  10 lbs for 5 reps   Improved DASH score by 10% Long Term Not measured     Treatment Plan:   Patient/family involved in developing goals and treatment plan: Yes  Freq visits every 3-4 weeks with HEP emphasis   Treatment plan inclusive of:   Exercise: AROM, AAROM, PROM, Stretching, Strengthening, Progressive Resistive, Coordination, Aerobic exercise   Functional: Plyometrics, Sports specific, Functional rehab    CPTs: Therapeutic Exercise (97110), Therapeutic Activity (21308), Neuromuscular Red-ed (65784) and Manual Therapy (97140)    Treatment today consisted of:    Treatment: Details   THERAPEUTIC EXERCISE    Reassessment performed    UBE - seat at 3  Resistance 3.0 Forward/retro 3 mins each/ 6 mins   Elbow Flexion with supination  Elbow Flexion hammer curls 7# 1 x 10, 9# 8 reps  10# 1 x 5 reps, 9# 10 reps    Wrist Flexion / Extension - seated over foam roll 6# 2 x 10     R Tband ER - blue  Towel at elbow, 2 x 10   R Tband IR - blue  Towel at elbow, 2 x 10    B wall slides flexion - tension in towel 10 reps, shoulder flexion 155 deg R=L   B counter top push ups 15 reps, good scapula control   OH raises B UE 5#  2 x 10 reps   THERAPEUTIC ACTIVITIES      Thank you for referring this patient to [place].         Education:  Updated HEP, Verbal cues for ther ex, Independent symptom management techniques    Thank you for referring this patient to [facility].    Nori Riis, PT, MPT       Time Reporting Minutes   Service-Based Procedures/ Modalities    Evaluation (high, moderate, low)    Re-evaluation    Traction, mechanical    Electric stimulation (unattended)    Total service-based billable procedures        Time-Based  Procedures / Modalities    Therapeutic ex  40   Neuromuscular Re-ed    Manual Therapy    Therapeutic Activities    Gait training, including stairs    Ultrasound    Electrical Stimulation    Iontophoresis    Physical Performance Test    Aquatic Therapy  Total Time-Based Billable Procedures 40       Total Skilled Treatment Time 40

## 2019-06-14 ENCOUNTER — Ambulatory Visit: Admitting: Physical Medicine and Rehabilitation

## 2019-06-23 ENCOUNTER — Encounter: Payer: Self-pay | Admitting: Physical Medicine and Rehabilitation

## 2019-06-23 ENCOUNTER — Ambulatory Visit: Admitting: Physical Medicine and Rehabilitation

## 2019-06-23 VITALS — BP 166/92 | HR 71 | Ht 68.0 in | Wt 165.0 lb

## 2019-06-23 DIAGNOSIS — M533 Sacrococcygeal disorders, not elsewhere classified: Secondary | ICD-10-CM

## 2019-06-23 NOTE — Progress Notes (Signed)
Alejandro Dennis  1955/07/19  [id number]    CC: Follow-up low back pain    HPI: Patient returns to the office with a complaints of left-sided low back pain of several months duration.  There is no radiation down the lower extremities.  Symptoms are aggravated with prolonged standing, sitting, twisting.  Patient cannot run.  Golfing is also pain provoking.    Symptoms are mitigated with lying down.    Patient states this is the same pain however it is on the opposite side for which she underwent a therapeutic right SI joint injection in November with 100% resolution of his right-sided pain.  He was able to resume all of his usual activities without pain.     Patient rates his pain as a 3-8/10.    Patient denies  Progressive lower extremity weakness, constitutional symptoms or bowel or bladder dysfunction.    Physical exam:    Vitals:    06/23/19 1000   BP: (!) 166/92   Pulse: 71   Weight: 74.8 kg (165 lb)   Height: 1.727 m (5\' 8" )     Alert awake appropriately oriented individual in no acute distress. Patient demonstrates the ability heel walk, toe walk, and tandem walk without difficulty or pain. Palpation of the lumbar spinous processes paraspinal muscles and bilateral trochanteric bursa were not pain provoking. Straight leg raise did not cause lower extremity pain. Patrick's maneuver was positive on the left.  Palpation over the left sacral sulcus was pain provoking.  Range of motion of the hips and knees were not pain provoking bilaterally. Strength is intact 5 out 5 lower extremities bilaterally throughout all lumbar myotomes.  Lumbar flexion and extension were performed without pain.  Truncal rotation was pain provoking.    SIJ provocative tests: (+)FABER (+/-) Compression (+) Gaenslen's    Impression:    Left-sided low back pain possibly SI joint mediated    Plan:    We will seek authorization for a therapeutic left SI joint injection under fluoroscopic guidance.    If there is no improvement after his initial  injection, I will proceed with a new MRI of the lumbar spine to assess for new or worsening spinal pathology.    Please feel free to contact me with any questions or concerns that you may have.    [place], [place].

## 2019-06-28 ENCOUNTER — Ambulatory Visit: Admitting: Rehabilitative and Restorative Service Providers"

## 2019-07-15 ENCOUNTER — Other Ambulatory Visit: Payer: Self-pay | Admitting: Gastroenterology

## 2019-07-20 ENCOUNTER — Ambulatory Visit
Admission: RE | Admit: 2019-07-20 | Discharge: 2019-07-20 | Disposition: A | Discharge status: Home / Self Care | Source: Ambulatory Visit | Attending: Physical Medicine and Rehabilitation | Admitting: Physical Medicine and Rehabilitation

## 2019-07-20 ENCOUNTER — Encounter: Payer: Self-pay | Admitting: Physical Medicine and Rehabilitation

## 2019-07-20 ENCOUNTER — Ambulatory Visit: Admitting: Physical Medicine and Rehabilitation

## 2019-07-20 ENCOUNTER — Other Ambulatory Visit: Payer: Self-pay | Admitting: Physical Medicine and Rehabilitation

## 2019-07-20 VITALS — BP 128/96 | HR 85 | Temp 98.4°F | Resp 16 | Ht 68.0 in | Wt 165.0 lb

## 2019-07-20 DIAGNOSIS — M533 Sacrococcygeal disorders, not elsewhere classified: Secondary | ICD-10-CM | POA: Insufficient documentation

## 2019-07-20 DIAGNOSIS — M545 Low back pain, unspecified: Secondary | ICD-10-CM

## 2019-07-20 MED ORDER — LIDOCAINE HCL 1 % IJ SOLN *I*
1.5000 mL | Freq: Once | INTRAMUSCULAR | Status: AC | PRN
Start: 2019-07-20 — End: 2019-07-20
  Administered 2019-07-20: 1.5 mL via SUBCUTANEOUS

## 2019-07-20 MED ORDER — LIDOCAINE HCL 2 % IJ SOLN *I*
0.5000 mL | Freq: Once | INTRAMUSCULAR | Status: AC | PRN
Start: 2019-07-20 — End: 2019-07-20
  Administered 2019-07-20: .5 mL

## 2019-07-20 MED ORDER — IOHEXOL 240 MG/ML (OMNIPAQUE) IV SOLN *I*
0.5000 mL | Freq: Once | INTRAMUSCULAR | Status: AC | PRN
Start: 2019-07-20 — End: 2019-07-20
  Administered 2019-07-20: .5 mL

## 2019-07-20 MED ORDER — DEXAMETHASONE SOD PHOSPHATE PF 10 MG/ML IJ SOLN *I*
12.0000 mg | Freq: Once | INTRAMUSCULAR | Status: AC | PRN
Start: 2019-07-20 — End: 2019-07-20
  Administered 2019-07-20: 12 mg

## 2019-07-20 NOTE — Progress Notes (Signed)
Pt arrived ambulatory. Drove self. Denies recent blood thinner and antibiotic use.

## 2019-07-20 NOTE — Procedures (Signed)
Date/Time: 07/20/2019 10:00 AM EDT  Tendons, Ligaments, Muscles, and Joint/Bursa Injection(s): SI Injection - CPT 27096  Laterality: Left  Contrast - Left: 0.5 mL iohexol 240 MG/ML  Anesthetic medication - Left:  0.5 mL lidocaine 2 %; 1.5 mL lidocaine hcl 1 %  Steroid medication - Left: 12 mg dexAMETHasone PF 10 MG/ML    Attending Physician:  Pincus Large. Mitzie Na, M.D.    Indication: SI Joint Region Pain M53.3  Procedure:  Left SI Joint Therapeutic Injection CPT Code:  81829    After a thorough discussion of the risks, benefits, potential complications, including but not limited to infection and bleeding, and alternatives to injection therapy the patient gave informed written consent to proceed.  The patient was placed prone on the fluoroscopic table and then using fluoroscopic guidance the Left SI joint was identified.  This area was then prepped with Clorhexidine X 3 which was allowed to dry. Then using 1.5 cc of 1% lidocaine the skin and subcutaneous tissue was anesthetized.  Then using a 22 gauge spinal needle the joint space was entered without difficulty.  1/2 cc of Omnipaque 240 was slowly infused showing good spread within the joint space.  Then 12 mg of Dexamethasone Sodium Phosphate and 0.5 cc of 2% lidocaine was slowly infused. The patient tolerated the procedure well, no complications were noted. The patient was observed in the recovery room without problems was released home in the care of his driver.  I will see the patient back in 2-4 weeks to review his response to the steroid component.  The patient understands that this injection is a part of their comprehensive rehabilitation program and they need to continue their functional rehabilitation.

## 2019-07-20 NOTE — Patient Instructions (Signed)
Post Spinal Injection/Procedure Instructions    Instructions:  Notify the office of the doctor who performed your procedure if you experience any of the following:  · Fever of 100 degrees Fahrenheit or 38 degrees Celsius or greater.  · Excessive swelling or redness at the injection site.  · Excessive pain or numbness that lasts longer than 72 hours after your injections.  · Severe headache that goes away when you lie flat and comes back when you sit upright    Activity:  · Rest after your injection for 24 hours, then return to your usual activities.  This includes any restrictions your physician may have prescribed.  · Do not drive the day of your injection.    Care of the injection site:  · You may ice the injection site for local discomfort.  Cover ice with a cloth and apply light pressure.  Never place ice directly on the skin.   Apply ice for 15 minutes on then off for 1 hour.  Repeat as needed.  · Take band aids off the following day.    For diabetic patients:  · Check your blood sugar level every 8 hours for the next 72 hours.  If it is greater than 250, call your primary care physician.    Medications:  · You may continue taking current medications.  · Remember, the steroid you had injected today usually takes 1 to 3 days to take effect, however it can take up to 7 days.    Contacts/Questions:  In case of an urgent issue or questions:  · Dr. Patel's secretary can be reached at [phone]  · Dr. Everett's secretary can be reached at [phone]  · Dr. Laplante's secretary can be reached at [phone]  · Dr. Speach's secretary can be reached at [phone]  · Dr. Orsini's secretary can be reached at [phone]  · If it is after 4:30 PM, a weekend or a holiday, call the answering service at [phone] and ask for the physician on call.    Follow up injection:  REPEAT AS NEEDED    Please call the Prior Auth team at [phone] or [phone] in 2 weeks to answer questions that are  required by your insurance company in order to get another injection approved by your insurance.  Once we obtain the authorization, our schedulers will call to schedule the injection.    If you have not heard within a week from the office after you have answered the questions after your first injection, please call the above number to check on this.    Please refrain from calling the secretaries as the Prior Auth Team is managing the requests for your insurance company.  You can either call the Prior Auth Team or your insurance company.      If you have a scheduled injection and you feel 90% better the day before or the day of the injection, please call [phone] to cancel the injection.

## 2019-08-09 ENCOUNTER — Other Ambulatory Visit
Admission: RE | Admit: 2019-08-09 | Discharge: 2019-08-09 | Disposition: A | Discharge status: Home / Self Care | Source: Ambulatory Visit | Attending: Pediatrics | Admitting: Pediatrics

## 2019-08-09 DIAGNOSIS — R030 Elevated blood-pressure reading, without diagnosis of hypertension: Secondary | ICD-10-CM | POA: Insufficient documentation

## 2019-08-09 DIAGNOSIS — E298 Other testicular dysfunction: Secondary | ICD-10-CM | POA: Insufficient documentation

## 2019-08-09 DIAGNOSIS — Z0000 Encounter for general adult medical examination without abnormal findings: Secondary | ICD-10-CM | POA: Insufficient documentation

## 2019-08-09 DIAGNOSIS — E7849 Other hyperlipidemia: Secondary | ICD-10-CM | POA: Insufficient documentation

## 2019-08-09 LAB — CBC AND DIFFERENTIAL
Baso # K/uL: 0 10*3/uL (ref 0.0–0.1)
Basophil %: 0.6 %
Eos # K/uL: 0.1 10*3/uL (ref 0.0–0.5)
Eosinophil %: 2.4 %
Hematocrit: 49 % (ref 40–51)
Hemoglobin: 16.5 g/dL (ref 13.7–17.5)
IMM Granulocytes #: 0 10*3/uL (ref 0.0–0.0)
IMM Granulocytes: 0.2 %
Lymph # K/uL: 1.6 10*3/uL (ref 1.3–3.6)
Lymphocyte %: 34.7 %
MCH: 32 pg (ref 26–32)
MCHC: 34 g/dL (ref 32–37)
MCV: 95 fL — ABNORMAL HIGH (ref 79–92)
Mono # K/uL: 0.5 10*3/uL (ref 0.3–0.8)
Monocyte %: 10.1 %
Neut # K/uL: 2.4 10*3/uL (ref 1.8–5.4)
Nucl RBC # K/uL: 0 10*3/uL (ref 0.0–0.0)
Nucl RBC %: 0 /100 WBC (ref 0.0–0.2)
Platelets: 200 10*3/uL (ref 150–330)
RBC: 5.2 MIL/uL (ref 4.6–6.1)
RDW: 12.7 % (ref 11.6–14.4)
Seg Neut %: 52 %
WBC: 4.7 10*3/uL (ref 4.2–9.1)

## 2019-08-09 LAB — COMPREHENSIVE METABOLIC PANEL
ALT: 41 U/L (ref 0–50)
AST: 37 U/L (ref 0–50)
Albumin: 4.6 g/dL (ref 3.5–5.2)
Alk Phos: 29 U/L — ABNORMAL LOW (ref 40–130)
Anion Gap: 12 (ref 7–16)
Bilirubin,Total: 1.7 mg/dL — ABNORMAL HIGH (ref 0.0–1.2)
CO2: 27 mmol/L (ref 20–28)
Calcium: 9.5 mg/dL (ref 8.6–10.2)
Chloride: 104 mmol/L (ref 96–108)
Creatinine: 1.11 mg/dL (ref 0.67–1.17)
GFR,Black: 81 *
GFR,Caucasian: 70 *
Glucose: 86 mg/dL (ref 60–99)
Lab: 27 mg/dL — ABNORMAL HIGH (ref 6–20)
Potassium: 4.6 mmol/L (ref 3.3–5.1)
Sodium: 143 mmol/L (ref 133–145)
Total Protein: 6.8 g/dL (ref 6.3–7.7)

## 2019-08-09 LAB — LIPID PANEL
Chol/HDL Ratio: 2.9
Cholesterol: 203 mg/dL — AB
HDL: 71 mg/dL — ABNORMAL HIGH (ref 40–60)
LDL Calculated: 111 mg/dL
Non HDL Cholesterol: 132 mg/dL
Triglycerides: 106 mg/dL

## 2019-08-09 LAB — TSH: TSH: 2.73 u[IU]/mL (ref 0.27–4.20)

## 2019-08-09 LAB — TESTOSTERONE, FREE, TOTAL
Testosterone,% Free: 1 %
Testosterone,Free: 7 pg/mL — ABNORMAL LOW (ref 47–244)
Testosterone: 51 ng/dL — ABNORMAL LOW (ref 193–740)

## 2019-08-09 LAB — SEX HORMONE BINDING GLOBULIN: Sex Hormone Binding Glob: 48 nmol/L (ref 10–80)

## 2019-10-05 ENCOUNTER — Other Ambulatory Visit
Admission: RE | Admit: 2019-10-05 | Discharge: 2019-10-05 | Disposition: A | Discharge status: Home / Self Care | Source: Ambulatory Visit | Attending: Internal Medicine | Admitting: Internal Medicine

## 2019-10-05 ENCOUNTER — Other Ambulatory Visit
Admission: RE | Admit: 2019-10-05 | Discharge: 2019-10-05 | Disposition: A | Discharge status: Home / Self Care | Source: Ambulatory Visit

## 2019-10-05 DIAGNOSIS — U071 COVID-19: Secondary | ICD-10-CM | POA: Insufficient documentation

## 2019-10-05 DIAGNOSIS — R21 Rash and other nonspecific skin eruption: Secondary | ICD-10-CM | POA: Insufficient documentation

## 2019-10-05 DIAGNOSIS — M791 Myalgia, unspecified site: Secondary | ICD-10-CM | POA: Insufficient documentation

## 2019-10-05 LAB — CBC AND DIFFERENTIAL
Baso # K/uL: 0 10*3/uL (ref 0.0–0.1)
Basophil %: 0.6 %
Eos # K/uL: 0.1 10*3/uL (ref 0.0–0.5)
Eosinophil %: 2 %
Hematocrit: 53 % — ABNORMAL HIGH (ref 40–51)
Hemoglobin: 17.4 g/dL (ref 13.7–17.5)
IMM Granulocytes #: 0 10*3/uL (ref 0.0–0.0)
IMM Granulocytes: 0.3 %
Lymph # K/uL: 0.6 10*3/uL — ABNORMAL LOW (ref 1.3–3.6)
Lymphocyte %: 9.9 %
MCH: 32 pg (ref 26–32)
MCHC: 33 g/dL (ref 32–37)
MCV: 96 fL — ABNORMAL HIGH (ref 79–92)
Mono # K/uL: 0.9 10*3/uL — ABNORMAL HIGH (ref 0.3–0.8)
Monocyte %: 14.2 %
Neut # K/uL: 4.7 10*3/uL (ref 1.8–5.4)
Nucl RBC # K/uL: 0 10*3/uL (ref 0.0–0.0)
Nucl RBC %: 0 /100 WBC (ref 0.0–0.2)
Platelets: 174 10*3/uL (ref 150–330)
RBC: 5.5 MIL/uL (ref 4.6–6.1)
RDW: 12.3 % (ref 11.6–14.4)
Seg Neut %: 73 %
WBC: 6.5 10*3/uL (ref 4.2–9.1)

## 2019-10-05 LAB — TSH: TSH: 1.95 u[IU]/mL (ref 0.27–4.20)

## 2019-10-05 LAB — SEDIMENTATION RATE, AUTOMATED: Sedimentation Rate: 18 mm/hr (ref 0–20)

## 2019-10-06 LAB — LYME AB SCREEN: Lyme AB Screen: NEGATIVE

## 2019-10-06 LAB — COVID-19 PCR

## 2019-10-06 LAB — COVID-19 NAAT (PCR): COVID-19 NAAT (PCR): POSITIVE — AB

## 2019-10-15 ENCOUNTER — Emergency Department

## 2019-10-15 ENCOUNTER — Inpatient Hospital Stay
Admission: EM | Admit: 2019-10-15 | Discharge: 2019-10-18 | DRG: 137 | Disposition: A | Discharge status: Home / Self Care | Source: Ambulatory Visit | Attending: Internal Medicine | Admitting: Internal Medicine

## 2019-10-15 DIAGNOSIS — Y998 Other external cause status: Secondary | ICD-10-CM

## 2019-10-15 DIAGNOSIS — R197 Diarrhea, unspecified: Secondary | ICD-10-CM | POA: Diagnosis present

## 2019-10-15 DIAGNOSIS — I2510 Atherosclerotic heart disease of native coronary artery without angina pectoris: Secondary | ICD-10-CM | POA: Diagnosis present

## 2019-10-15 DIAGNOSIS — U071 COVID-19: Principal | ICD-10-CM | POA: Diagnosis present

## 2019-10-15 DIAGNOSIS — M2550 Pain in unspecified joint: Secondary | ICD-10-CM | POA: Diagnosis present

## 2019-10-15 DIAGNOSIS — J9601 Acute respiratory failure with hypoxia: Secondary | ICD-10-CM | POA: Diagnosis present

## 2019-10-15 DIAGNOSIS — X58XXXA Exposure to other specified factors, initial encounter: Secondary | ICD-10-CM | POA: Diagnosis present

## 2019-10-15 DIAGNOSIS — R05 Cough: Secondary | ICD-10-CM

## 2019-10-15 DIAGNOSIS — R5383 Other fatigue: Secondary | ICD-10-CM | POA: Diagnosis present

## 2019-10-15 DIAGNOSIS — I10 Essential (primary) hypertension: Secondary | ICD-10-CM | POA: Diagnosis present

## 2019-10-15 DIAGNOSIS — I6521 Occlusion and stenosis of right carotid artery: Secondary | ICD-10-CM | POA: Diagnosis present

## 2019-10-15 DIAGNOSIS — R7989 Other specified abnormal findings of blood chemistry: Secondary | ICD-10-CM | POA: Diagnosis present

## 2019-10-15 DIAGNOSIS — J029 Acute pharyngitis, unspecified: Secondary | ICD-10-CM | POA: Diagnosis present

## 2019-10-15 DIAGNOSIS — J1282 Pneumonia due to coronavirus disease 2019: Secondary | ICD-10-CM | POA: Diagnosis present

## 2019-10-15 DIAGNOSIS — E785 Hyperlipidemia, unspecified: Secondary | ICD-10-CM | POA: Diagnosis present

## 2019-10-15 DIAGNOSIS — I779 Disorder of arteries and arterioles, unspecified: Secondary | ICD-10-CM | POA: Diagnosis present

## 2019-10-15 DIAGNOSIS — R918 Other nonspecific abnormal finding of lung field: Secondary | ICD-10-CM

## 2019-10-15 DIAGNOSIS — Z8616 Personal history of COVID-19: Secondary | ICD-10-CM

## 2019-10-15 DIAGNOSIS — R0902 Hypoxemia: Secondary | ICD-10-CM

## 2019-10-15 DIAGNOSIS — Y9289 Other specified places as the place of occurrence of the external cause: Secondary | ICD-10-CM

## 2019-10-15 DIAGNOSIS — T39395A Adverse effect of other nonsteroidal anti-inflammatory drugs [NSAID], initial encounter: Secondary | ICD-10-CM | POA: Diagnosis present

## 2019-10-15 DIAGNOSIS — I739 Peripheral vascular disease, unspecified: Secondary | ICD-10-CM | POA: Diagnosis present

## 2019-10-15 DIAGNOSIS — R509 Fever, unspecified: Secondary | ICD-10-CM

## 2019-10-15 DIAGNOSIS — R110 Nausea: Secondary | ICD-10-CM | POA: Diagnosis present

## 2019-10-15 DIAGNOSIS — R519 Headache, unspecified: Secondary | ICD-10-CM | POA: Diagnosis present

## 2019-10-15 DIAGNOSIS — Y9389 Activity, other specified: Secondary | ICD-10-CM

## 2019-10-15 DIAGNOSIS — E860 Dehydration: Secondary | ICD-10-CM | POA: Diagnosis present

## 2019-10-15 LAB — CBC AND DIFFERENTIAL
Baso # K/uL: 0 10*3/uL (ref 0.0–0.1)
Basophil %: 0.1 %
Eos # K/uL: 0 10*3/uL (ref 0.0–0.5)
Eosinophil %: 0.1 %
Hematocrit: 49 % (ref 40–51)
Hemoglobin: 16.1 g/dL (ref 13.7–17.5)
IMM Granulocytes #: 0 10*3/uL (ref 0.0–0.0)
IMM Granulocytes: 0.4 %
Lymph # K/uL: 0.6 10*3/uL — ABNORMAL LOW (ref 1.3–3.6)
Lymphocyte %: 7.9 %
MCH: 31 pg (ref 26–32)
MCHC: 33 g/dL (ref 32–37)
MCV: 95 fL — ABNORMAL HIGH (ref 79–92)
Mono # K/uL: 0.4 10*3/uL (ref 0.3–0.8)
Monocyte %: 5.6 %
Neut # K/uL: 6.2 10*3/uL — ABNORMAL HIGH (ref 1.8–5.4)
Nucl RBC # K/uL: 0 10*3/uL (ref 0.0–0.0)
Nucl RBC %: 0 /100 WBC (ref 0.0–0.2)
Platelets: 212 10*3/uL (ref 150–330)
RBC: 5.1 MIL/uL (ref 4.6–6.1)
RDW: 12.2 % (ref 11.6–14.4)
Seg Neut %: 85.9 %
WBC: 7.3 10*3/uL (ref 4.2–9.1)

## 2019-10-15 LAB — COMPREHENSIVE METABOLIC PANEL
ALT: 110 U/L — ABNORMAL HIGH (ref 0–50)
AST: 131 U/L — ABNORMAL HIGH (ref 0–50)
Albumin: 3.5 g/dL (ref 3.5–5.2)
Alk Phos: 40 U/L (ref 40–130)
Anion Gap: 12 (ref 7–16)
Bilirubin,Total: 0.4 mg/dL (ref 0.0–1.2)
CO2: 22 mmol/L (ref 20–28)
Calcium: 8.5 mg/dL — ABNORMAL LOW (ref 8.6–10.2)
Chloride: 103 mmol/L (ref 96–108)
Creatinine: 1.1 mg/dL (ref 0.67–1.17)
GFR,Black: 82 *
GFR,Caucasian: 71 *
Glucose: 108 mg/dL — ABNORMAL HIGH (ref 60–99)
Lab: 16 mg/dL (ref 6–20)
Potassium: 4.2 mmol/L (ref 3.4–4.7)
Sodium: 137 mmol/L (ref 133–145)
Total Protein: 7.2 g/dL (ref 6.3–7.7)

## 2019-10-15 MED ORDER — ROSUVASTATIN CALCIUM 5 MG PO TABS *I*
10.0000 mg | ORAL_TABLET | Freq: Every day | ORAL | Status: DC
Start: 2019-10-16 — End: 2019-10-16

## 2019-10-15 MED ORDER — ACETAMINOPHEN 325 MG PO TABS *I*
650.0000 mg | ORAL_TABLET | Freq: Four times a day (QID) | ORAL | Status: DC | PRN
Start: 2019-10-15 — End: 2019-10-16
  Administered 2019-10-15: 650 mg via ORAL
  Filled 2019-10-15: qty 2

## 2019-10-15 MED ORDER — MAGNESIUM HYDROXIDE 400 MG/5ML PO SUSP *I*
30.0000 mL | Freq: Every day | ORAL | Status: DC | PRN
Start: 2019-10-15 — End: 2019-10-18

## 2019-10-15 MED ORDER — DEXAMETHASONE SODIUM PHOSPHATE 10 MG/ML IJ SOLN *I*
10.0000 mg | Freq: Once | INTRAMUSCULAR | Status: AC
Start: 2019-10-15 — End: 2019-10-15
  Administered 2019-10-15: 10 mg via INTRAVENOUS
  Filled 2019-10-15: qty 1

## 2019-10-15 MED ORDER — ASPIRIN 81 MG PO TBEC *I*
81.0000 mg | DELAYED_RELEASE_TABLET | Freq: Every morning | ORAL | Status: DC
Start: 2019-10-16 — End: 2019-10-18
  Administered 2019-10-16 – 2019-10-18 (×3): 81 mg via ORAL
  Filled 2019-10-15 (×3): qty 1

## 2019-10-15 MED ORDER — DOCUSATE SODIUM 100 MG PO CAPS *I*
100.0000 mg | ORAL_CAPSULE | Freq: Two times a day (BID) | ORAL | Status: DC | PRN
Start: 2019-10-15 — End: 2019-10-18

## 2019-10-15 MED ORDER — AMLODIPINE BESYLATE 5 MG PO TABS *I*
5.0000 mg | ORAL_TABLET | Freq: Every day | ORAL | Status: DC
Start: 2019-10-17 — End: 2019-10-16

## 2019-10-15 MED ORDER — DEXAMETHASONE SODIUM PHOSPHATE 4 MG/ML INJ SOLN *WRAPPED*
6.0000 mg | INTRAMUSCULAR | Status: DC
Start: 2019-10-16 — End: 2019-10-18
  Administered 2019-10-16 – 2019-10-17 (×2): 6 mg via INTRAVENOUS
  Filled 2019-10-15 (×2): qty 2

## 2019-10-15 MED ORDER — METOCLOPRAMIDE HCL 5 MG/ML IJ SOLN *I*
10.0000 mg | Freq: Four times a day (QID) | INTRAMUSCULAR | Status: DC | PRN
Start: 2019-10-15 — End: 2019-10-18

## 2019-10-15 MED ORDER — ONDANSETRON HCL 2 MG/ML IV SOLN *I*
4.0000 mg | Freq: Four times a day (QID) | INTRAMUSCULAR | Status: DC | PRN
Start: 2019-10-15 — End: 2019-10-18

## 2019-10-15 MED ORDER — ACETAMINOPHEN 650 MG RE SUPP *I*
650.0000 mg | Freq: Four times a day (QID) | RECTAL | Status: DC | PRN
Start: 2019-10-15 — End: 2019-10-16

## 2019-10-15 MED ORDER — ENOXAPARIN SODIUM 40 MG/0.4ML IJ SOSY *I*
40.0000 mg | PREFILLED_SYRINGE | Freq: Every day | INTRAMUSCULAR | Status: DC
Start: 2019-10-16 — End: 2019-10-18
  Filled 2019-10-15 (×2): qty 0.4

## 2019-10-15 NOTE — ED Notes (Signed)
Report Given To      Descriptive Sentence / Reason for Admission   Covid + test on sept 13th per patient. He came in for worsening SOB and "low 02".      Active Issues / Relevant Events   Unable to maintain adequate oxygenation on room air.       To Do List  Orders, evening medication      Anticipatory Guidance / Discharge Planning

## 2019-10-15 NOTE — ED Triage Notes (Signed)
Patient COVID + 10/04/2019, now with continued SOB.  Patient not vaccintated       Triage Note   Verdis Prime, RN

## 2019-10-15 NOTE — ED Provider Notes (Signed)
History     Chief Complaint   Patient presents with    Shortness of Breath     64yo male with PMH of htn and hyperlipidemia presents to the ED for evaluation of COVID symptoms since 9/12, tested positive 9/13. Patient states that his symptoms have not improved and he feels very short of breath. Reports shallow respirations and coughing with deep inspiration. States that he is alternating Tylenol and Advil every 6 hours for persistent fever. Patient is not vaccinated for COVID 19.           Medical/Surgical/Family History     Past Medical History:   Diagnosis Date    Carotid artery stenosis, asymptomatic, right     HLD (hyperlipidemia)         Patient Active Problem List   Diagnosis Code    Cervical radiculopathy M54.12    L ICA stenosis s/p L CEA 6/28 I65.21    Bilateral carotid artery disease I77.9    Pneumonia due to COVID-19 virus U07.1, J12.82    COVID-19 U07.1            Past Surgical History:   Procedure Laterality Date    Carotid endarterectomy Right 07/19/2015    INGUINAL HERNIA REPAIR Right 2010     Family History   Problem Relation Age of Onset    Other Father         Carotid artery stenosis s/p BCEA    Lung cancer Father     Stroke Neg Hx     Heart attack Neg Hx     Heart surgery Neg Hx     Diabetes Neg Hx           Social History     Tobacco Use    Smoking status: Never Smoker    Smokeless tobacco: Never Used   Substance Use Topics    Alcohol use: Yes     Alcohol/week: 5.0 standard drinks     Types: 5 Standard drinks or equivalent per week    Drug use: No     Living Situation     Questions Responses    Patient lives with Spouse    Homeless No    Caregiver for other family member No    External Services None    Employment Employed    Domestic Violence Risk No                Review of Systems   Review of Systems   Constitutional: Positive for fatigue and fever. Negative for appetite change and chills.   HENT: Positive for congestion and sore throat. Negative for postnasal drip and  rhinorrhea.    Respiratory: Positive for cough and shortness of breath. Negative for chest tightness.    Cardiovascular: Negative for chest pain.   Gastrointestinal: Negative for abdominal pain, constipation, diarrhea, nausea and vomiting.   Musculoskeletal: Positive for myalgias.   Neurological: Negative for weakness, light-headedness, numbness and headaches.   All other systems reviewed and are negative.      Physical Exam     Triage Vitals  Triage Start: Start, (10/15/19 1719)   First Recorded BP: (!) 164/103, Resp: 20, Temp: 36.4 C (97.5 F), Temp src: TEMPORAL Oxygen Therapy SpO2: (!) 89 %, Oximetry Source: Rt Hand, O2 Device: None (Room air), Heart Rate: 65, (10/15/19 1720)  .  First Pain Reported  0-10 Scale: 0, (10/15/19 2029)       Physical Exam  Vitals and nursing note reviewed.   Constitutional:  Appearance: He is well-developed.   HENT:      Head: Normocephalic and atraumatic.   Eyes:      Pupils: Pupils are equal, round, and reactive to light.   Cardiovascular:      Rate and Rhythm: Normal rate and regular rhythm.      Heart sounds: Normal heart sounds.   Pulmonary:      Effort: Pulmonary effort is normal.      Breath sounds: Decreased breath sounds and wheezing present.   Musculoskeletal:         General: Normal range of motion.      Cervical back: Normal range of motion and neck supple.   Skin:     General: Skin is warm and dry.   Neurological:      Mental Status: He is alert and oriented to person, place, and time.         Medical Decision Making   Patient seen by me on:  10/15/2019    Assessment:  63yo male presents to the ED for evaluation of increased SOB with known COVID.     Differential diagnosis:  COVID, pneumonia, cardiac arrhythmia, hypoxia     Plan:  Orders Placed This Encounter    CBC, CMP, EKG, CXR      EKG Interpretation: normal sinus rhythm, no ischemic changes    Independent review of: Existing labs, chart/prior records    ED Course and Disposition:  Admit for COVID and hypoxia.    Given decadron.           Discussed case with Dr. Preston Fleeting. Provider agrees with work up and treatment plan.     [address], [place]          Pilar Jarvis [place], [place]  10/15/19 [phone]

## 2019-10-15 NOTE — First Provider Contact (Signed)
ED First Provider Contact Note    Initial provider evaluation performed by Ellis Savage, NP     Vital signs reviewed.    Assessment: COVID symptoms since 9/12, tested positive 9/13. Patient states that his symptoms have not improved and he feels very short f breath. Reports shallow respirations and coughing with deep inspiration. States that he is alternating Tylenol and Advil every 6 hours for persistent fever. Patient is not vaccinated for COVID 19.     Orders placed:  Orders Placed This Encounter   Procedures    *Chest STANDARD single view    CBC and differential    Comprehensive metabolic panel    EKG 12 lead (initial)     SpO2 88% RA, placed on 2 L NC      Patient requires further evaluation.     Ellis Savage, NP, 10/15/2019, 5:20 PM     Ellis Savage, NP  10/15/19 1723

## 2019-10-16 ENCOUNTER — Encounter: Payer: Self-pay | Admitting: Internal Medicine

## 2019-10-16 DIAGNOSIS — I6523 Occlusion and stenosis of bilateral carotid arteries: Secondary | ICD-10-CM

## 2019-10-16 DIAGNOSIS — J9601 Acute respiratory failure with hypoxia: Secondary | ICD-10-CM

## 2019-10-16 DIAGNOSIS — J1282 Pneumonia due to coronavirus disease 2019: Secondary | ICD-10-CM

## 2019-10-16 DIAGNOSIS — E785 Hyperlipidemia, unspecified: Secondary | ICD-10-CM | POA: Diagnosis present

## 2019-10-16 DIAGNOSIS — U071 COVID-19: Principal | ICD-10-CM

## 2019-10-16 DIAGNOSIS — I6521 Occlusion and stenosis of right carotid artery: Secondary | ICD-10-CM

## 2019-10-16 LAB — CBC AND DIFFERENTIAL
Baso # K/uL: 0 10*3/uL (ref 0.0–0.1)
Basophil %: 0.4 %
Eos # K/uL: 0 10*3/uL (ref 0.0–0.5)
Eosinophil %: 0 %
Hematocrit: 56 % — ABNORMAL HIGH (ref 40–51)
Hemoglobin: 17.7 g/dL — ABNORMAL HIGH (ref 13.7–17.5)
IMM Granulocytes #: 0 10*3/uL (ref 0.0–0.0)
IMM Granulocytes: 0.4 %
Lymph # K/uL: 0.5 10*3/uL — ABNORMAL LOW (ref 1.3–3.6)
Lymphocyte %: 11 %
MCH: 31 pg (ref 26–32)
MCHC: 32 g/dL (ref 32–37)
MCV: 98 fL — ABNORMAL HIGH (ref 79–92)
Mono # K/uL: 0.2 10*3/uL — ABNORMAL LOW (ref 0.3–0.8)
Monocyte %: 4.9 %
Neut # K/uL: 3.7 10*3/uL (ref 1.8–5.4)
Nucl RBC # K/uL: 0 10*3/uL (ref 0.0–0.0)
Nucl RBC %: 0 /100 WBC (ref 0.0–0.2)
Platelets: 207 10*3/uL (ref 150–330)
RBC: 5.7 MIL/uL (ref 4.6–6.1)
RDW: 12.2 % (ref 11.6–14.4)
Seg Neut %: 83.3 %
WBC: 4.5 10*3/uL (ref 4.2–9.1)

## 2019-10-16 LAB — COMPREHENSIVE METABOLIC PANEL
ALT: 130 U/L — ABNORMAL HIGH (ref 0–50)
AST: 133 U/L — ABNORMAL HIGH (ref 0–50)
Albumin: 3 g/dL — ABNORMAL LOW (ref 3.5–5.2)
Alk Phos: 42 U/L (ref 40–130)
Anion Gap: 13 (ref 7–16)
Bilirubin,Total: 0.3 mg/dL (ref 0.0–1.2)
CO2: 22 mmol/L (ref 20–28)
Calcium: 8.6 mg/dL (ref 8.6–10.2)
Chloride: 103 mmol/L (ref 96–108)
Creatinine: 0.96 mg/dL (ref 0.67–1.17)
GFR,Black: 97 *
GFR,Caucasian: 84 *
Glucose: 142 mg/dL — ABNORMAL HIGH (ref 60–99)
Lab: 18 mg/dL (ref 6–20)
Potassium: 4.9 mmol/L — ABNORMAL HIGH (ref 3.4–4.7)
Sodium: 138 mmol/L (ref 133–145)
Total Protein: 7.2 g/dL (ref 6.3–7.7)

## 2019-10-16 LAB — CK: CK: 71 U/L (ref 39–308)

## 2019-10-16 LAB — TROPONIN T 0 HR HIGH SENSITIVITY (IP/ED ONLY): TROP T 0 HR High Sensitivity: 8 ng/L (ref 0–21)

## 2019-10-16 LAB — FERRITIN: Ferritin: 4013 ng/mL — ABNORMAL HIGH (ref 20–250)

## 2019-10-16 LAB — PHOSPHORUS: Phosphorus: 3.7 mg/dL (ref 2.7–4.5)

## 2019-10-16 LAB — APTT: aPTT: 30.7 s (ref 25.8–37.9)

## 2019-10-16 LAB — MRSA (ORSA) AMPLIFICATION: MRSA (ORSA) Amplification: 0

## 2019-10-16 LAB — EKG 12-LEAD
P: 52 deg
PR: 147 ms
QRS: 65 deg
QRSD: 96 ms
QT: 398 ms
QTc: 464 ms
Rate: 81 {beats}/min
T: 125 deg

## 2019-10-16 LAB — LACTATE, PLASMA: Lactate: 1.5 mmol/L (ref 0.5–2.2)

## 2019-10-16 LAB — PROTIME-INR
INR: 1 (ref 0.9–1.1)
Protime: 11.4 s (ref 10.0–12.9)

## 2019-10-16 LAB — D-DIMER, QUANTITATIVE: D-Dimer: 0.78 ug/mL FEU — ABNORMAL HIGH (ref 0.00–0.50)

## 2019-10-16 LAB — MAGNESIUM: Magnesium: 2.2 mg/dL (ref 1.6–2.5)

## 2019-10-16 LAB — LACTATE DEHYDROGENASE: LD: 399 U/L — ABNORMAL HIGH (ref 118–225)

## 2019-10-16 LAB — CRP: CRP: 250 mg/L — ABNORMAL HIGH (ref 0–8)

## 2019-10-16 LAB — NT-PRO BNP: NT-pro BNP: 371 pg/mL (ref 0–900)

## 2019-10-16 LAB — TRIGLYCERIDES: Triglycerides: 148 mg/dL

## 2019-10-16 LAB — PROCALCITONIN: Procalcitonin: 0.21 ng/mL — ABNORMAL HIGH (ref 0.00–0.08)

## 2019-10-16 MED ORDER — IBUPROFEN 400 MG PO TABS *I*
800.0000 mg | ORAL_TABLET | Freq: Four times a day (QID) | ORAL | Status: DC | PRN
Start: 2019-10-16 — End: 2019-10-18
  Administered 2019-10-16: 800 mg via ORAL
  Filled 2019-10-16: qty 2

## 2019-10-16 MED ORDER — SODIUM CHLORIDE 0.9 % IV SOLN WRAPPED *I*
50.0000 mL/h | Status: DC
Start: 2019-10-16 — End: 2019-10-17
  Administered 2019-10-16 – 2019-10-17 (×4): 125 mL/h via INTRAVENOUS

## 2019-10-16 MED ORDER — SODIUM CHLORIDE 0.9 % IV SOLN WRAPPED *I*
200.0000 mg | Freq: Once | INTRAVENOUS | Status: DC
Start: 2019-10-16 — End: 2019-10-16
  Filled 2019-10-16: qty 40

## 2019-10-16 MED ORDER — PANTOPRAZOLE SODIUM 40 MG IV SOLR *I*
40.0000 mg | Freq: Every morning | INTRAVENOUS | Status: DC
Start: 2019-10-16 — End: 2019-10-16

## 2019-10-16 MED ORDER — ACETAMINOPHEN 500 MG PO TABS *I*
1000.0000 mg | ORAL_TABLET | Freq: Three times a day (TID) | ORAL | Status: DC | PRN
Start: 2019-10-16 — End: 2019-10-18
  Administered 2019-10-17: 1000 mg via ORAL
  Filled 2019-10-16: qty 2

## 2019-10-16 MED ORDER — SODIUM CHLORIDE 0.9 % IV SOLN WRAPPED *I*
100.0000 mg | Freq: Every day | INTRAVENOUS | Status: DC
Start: 2019-10-17 — End: 2019-10-16

## 2019-10-16 MED ORDER — BENZONATATE 100 MG PO CAPS *I*
200.0000 mg | ORAL_CAPSULE | Freq: Three times a day (TID) | ORAL | Status: DC | PRN
Start: 2019-10-16 — End: 2019-10-18
  Administered 2019-10-17: 200 mg via ORAL
  Filled 2019-10-16: qty 2

## 2019-10-16 MED ORDER — CHOLECALCIFEROL 1000 UNIT PO TABS *I*
2000.0000 [IU] | ORAL_TABLET | Freq: Every day | ORAL | Status: DC
Start: 2019-10-17 — End: 2019-10-18
  Administered 2019-10-17 – 2019-10-18 (×2): 2000 [IU] via ORAL
  Filled 2019-10-16 (×2): qty 2

## 2019-10-16 MED ORDER — ALBUTEROL SULFATE HFA 108 (90 BASE) MCG/ACT IN AERS *I*
2.0000 | INHALATION_SPRAY | RESPIRATORY_TRACT | Status: DC | PRN
Start: 2019-10-16 — End: 2019-10-18
  Filled 2019-10-16: qty 8

## 2019-10-16 MED ORDER — AZITHROMYCIN 500MG IN NS 275ML IVPB *I*
500.0000 mg | INTRAVENOUS | Status: DC
Start: 2019-10-16 — End: 2019-10-18
  Administered 2019-10-16 – 2019-10-17 (×2): 500 mg via INTRAVENOUS
  Filled 2019-10-16 (×2): qty 275

## 2019-10-16 MED ORDER — PANTOPRAZOLE SODIUM 40 MG PO TBEC *I*
40.0000 mg | DELAYED_RELEASE_TABLET | Freq: Every morning | ORAL | Status: DC
Start: 2019-10-16 — End: 2019-10-18
  Administered 2019-10-16 – 2019-10-18 (×3): 40 mg via ORAL
  Filled 2019-10-16 (×3): qty 1

## 2019-10-16 MED ORDER — CEFTRIAXONE SODIUM 2 G IN STERILE WATER 20ML SYRINGE *I*
2000.0000 mg | INTRAVENOUS | Status: DC
Start: 2019-10-16 — End: 2019-10-18
  Administered 2019-10-16 – 2019-10-17 (×2): 2000 mg via INTRAVENOUS
  Filled 2019-10-16 (×2): qty 20

## 2019-10-16 MED ORDER — GUAIFENESIN-CODEINE 100-10 MG/5ML PO SYRP *I*
10.0000 mL | ORAL_SOLUTION | ORAL | Status: DC | PRN
Start: 2019-10-16 — End: 2019-10-18

## 2019-10-16 MED ORDER — IPRATROPIUM-ALBUTEROL 0.5-2.5 MG/3ML IN SOLN *I*
3.0000 mL | RESPIRATORY_TRACT | Status: DC | PRN
Start: 2019-10-16 — End: 2019-10-18

## 2019-10-16 MED ORDER — ZINC SULFATE 220 MG PO CAPS *I*
220.0000 mg | ORAL_CAPSULE | Freq: Every day | ORAL | Status: DC
Start: 2019-10-17 — End: 2019-10-18
  Administered 2019-10-17 – 2019-10-18 (×2): 220 mg via ORAL
  Filled 2019-10-16 (×2): qty 1

## 2019-10-16 MED ORDER — ACETAMINOPHEN 650 MG RE SUPP *I*
650.0000 mg | Freq: Four times a day (QID) | RECTAL | Status: DC | PRN
Start: 2019-10-16 — End: 2019-10-18

## 2019-10-16 NOTE — Progress Notes (Signed)
10/16/19 1000   [facility] Patient Class Review   Patient Class Review Inpatient   Patient Class Effective 10/15/19.

## 2019-10-16 NOTE — Progress Notes (Addendum)
Daily Progress Note  Hospital Medicine      Patient Name: Alejandro Dennis  Date of Birth: [birth year]  MRN#: [id number]  Admission Date: 10/15/2019  Date of Service: 10/16/2019    Interval History  Patient seen and examined, reviewed event and data   Having sob with any activity and increase cough with proning  Has green expectoration and no other complains   Wishes to now how he can help in his care   r of s x 10 is negative otherwise   Physical Exam:  Vital Signs:  BP 140/68 (BP Location: Left arm)    Pulse 83    Temp (!) 38.1 C (100.6 F) (Oral)    Resp 17    Ht 1.727 m ('5\' 8"' )    Wt 74.8 kg (165 lb)    SpO2 91%    BMI 25.09 kg/m   Febrile this am   HTN on admission now normotensive   HR 83  On 4l Sat 91%  General: Alert in respiratory distress with ambulation, in good general conditions with a normal BMI  HEENT: Moist mucosa  no thrush or exudate                 Pupils equally round, EOMI.                sclera normal  Neck: supple    Cardiovascular: NS1S2 . no murmurs or JVP   Pulmonary/Chest: Clear to auscultation bilaterally. No crackles or wheezes.  Abdominal: Soft. Nontender. Nondistended. No organomegaly   Extremity: no edema.  Neurologic: alert and oriented . No focal deficits.  Skin  no new rash or lesion     Current MEDS    Scheduled                                                                                                                                         pantoprazole  40 mg Oral QAM    [START ON 10/17/2019] zinc sulfate  220 mg Oral Daily    [START ON 10/17/2019] cholecalciferol  2,000 units Oral Daily    remdesivir  200 mg Intravenous Once    Followed by    [START ON 10/17/2019] remdesivir  100 mg Intravenous Daily @ 1800    aspirin  81 mg Oral QAM    enoxaparin  40 mg Subcutaneous Daily @ 2100    dexamethasone  6 mg Intravenous Q24H  Infusions   sodium chloride 125 mL/hr (10/16/19 0943)     Lab  Results:  Recent Labs   Lab 10/16/19  0622 10/15/19  1733   WBC 4.5 7.3   Hemoglobin 17.7* 16.1   Hematocrit 56* 49   Platelets 207 212     Recent Labs   Lab 10/16/19  0625 10/15/19  1733   Sodium 138 137   Potassium 4.9* 4.2   Chloride 103 103   CO2 22 22   UN 18 16   Creatinine 0.96 1.10   Calcium 8.6 8.5*   Albumin 3.0* 3.5   Total Protein 7.2 7.2   Bilirubin,Total 0.3 0.4   Alk Phos 42 40   ALT 130* 110*   AST 133* 131*   Glucose 142* 108*     Radiology impressions (last 3 days):  *Chest STANDARD single view    Result Date: 10/15/2019  Bilateral mid to lower lung patchy airspace opacities, concerning for infection. Clinical correlation is recommended. END OF IMPRESSION [facility] submits this DICOM format image data and final report to the [facility], an independent secure electronic health information exchange, on a reciprocally searchable basis (with patient authorization) for a minimum of 12 months after exam date.         EXERCISE STRESS ECHO LIMITED (ISCHEMIA ONLY) 09/02/2018    Interpretation Summary   No evidence of myocardial ischemia by EKG or echo criteria.   The blood pressure response to exercise was hypertensive.   Normal resting LV ejection fraction and wall motion.   The patient's functional capacity is normal.    Hospital Problems  Active Hospital Problems    Diagnosis    Pneumonia due to COVID-19 virus    COVID-19    HLD (hyperlipidemia)    Bilateral carotid artery disease    R ICA stenosis s/p R CEA 6/28      Resolved Hospital Problems   No resolved problems to display.      Assessment and Plan:  64 y.o. male very active and independent, lifetime non smoker, with medical hx significant for: HLD (hyperlipidemia) and PVD R ICA stenosis s/p R CEA 6/28 Bilateral carotid artery disease  presenting with hypoxia in the setting of COVID diagnosis 10/04/19. Symptoms started 10/03/19 with severe achiness / generalize arthralgias. Symptoms have been worsening  include coughing fits, dyspnea,  tachypnea with talking, inability to take deep breaths due to coughing, occasional  brown-green. He's been running a temp pretty much ATC so has been taking 800 mg ibu q6h followed by 1036m tylenol 30 mins later for the past several days.  Also had few episodes of loose stool and decreased sense of taste / smell   Patient likely had COVID in 02/2018 but testing was not available at the time. For this reason he did not get vaccinated.     covid pneumonitis  Acute hypoxemic respiratory failure  Now 13 days after start of symptoms  And on decadron 641m(on PPI) and Ramdesevir and will discuss with pulmonary medicine the benefit of it  Is having difficulty with the prone position as it increases his cough  Continue follow up of o2 sat   CXR Bilateral mid to lower lung patchy airspace opacities,  Febrile and blood cultures requested  Has a low pro calcitonin  Elevated CRP, and DD    Has no leucocytosis or shift   On Vit D  And Zinc     Hemo concentrated   Dehydrated and fluids added  Has a low NT     HLD continue statin,   PVD continue ASA   EKG  RSR multifocal VE T wave abnormalities lateral leads   One trop negative   Continue ASA     has mild elevation of the LFT worse today no abd pain and will follow am     dvt   Enoxaparin     Anticipate discharge home in the next days   Discussed with Dr Arthor Captain, will start treatment for CAP discontinue Ramdesivir and MRSA requested   Author:   Delton See, MD   10/16/2019  11:40 AM

## 2019-10-16 NOTE — H&P (Signed)
H&P ADMISSION NOTE  Hospital Medicine    Patient Name: Alejandro Dennis  DOB: 1955-12-04  MRN: [id number]       Admission Date: 10/15/2019  Date of Service: 10/16/2019    CC   Dyspnea  Hypoxia  HPI     64 y.o. male with medical hx significant for:   HLD (hyperlipidemia)   R ICA stenosis s/p R CEA 6/28   Bilateral carotid artery disease    presenting with hypoxia in the setting of COVID diagnosis 10/04/19. Symptoms started 10/03/19 with severe achiness / generalize arthralgias. He saw his PCP and was COVID tested the next day, resulted positive. Symptoms have been worsening since then and include coughing fits, dyspnea, tachypnea with talking, inability to take deep breaths due to coughing, occasional sputum (sometimes clear, other times brown-green). He's been running a temp pretty much ATC so has been taking 800 mg ibu q6h followed by 1000mg  tylenol 30 mins later for the past several days. This has kept his temp down and resolved a severe headache he was having, however has been having mild nausea after ibuprofen but denies melena/GI bleeding. His neighbor dropped off a pulse oximeter and he found his sats were 87-88% today so came to the ED. Also had few episodes of loose stool and decreased sense of taste / smell but no nasal congestion or sore throat. At baseline he is very healthy and was jogger up to 2 weeks ago.     Patient likely had COVID in 02/2018 but testing was not available at the time. For this reason he did not get vaccinated. At that time he had loss of taste / smell, severe cough, headaches, and fevers. He doesn't remember how long it lasted but he did not require hospitalization or medical treatment and it resolved on its own.     ROS   Review of Systems   Constitutional: Positive for fever and malaise/fatigue. Negative for chills and diaphoresis.   HENT: Negative for congestion, ear pain and sore throat.         Diminished taste / smell but not for everything  Mild rhinorrhea   Eyes: Negative for  photophobia and pain.   Respiratory: Positive for cough, sputum production and shortness of breath. Negative for hemoptysis and wheezing.    Cardiovascular: Negative for chest pain, palpitations, orthopnea, leg swelling and PND.   Gastrointestinal: Positive for diarrhea (very mild) and nausea. Negative for abdominal pain, blood in stool, constipation, melena and vomiting.   Genitourinary: Negative for dysuria, flank pain, frequency and hematuria.   Musculoskeletal: Positive for joint pain (improving, diffuse). Negative for falls and myalgias.   Skin: Negative for itching and rash.   Neurological: Positive for headaches. Negative for dizziness, tingling, sensory change, speech change, focal weakness, loss of consciousness and weakness.     HISTORY   Medical History  Patient Active Problem List   Diagnosis Code    R ICA stenosis s/p R CEA 6/28 I65.21    Bilateral carotid artery disease I77.9    Pneumonia due to COVID-19 virus U07.1, J12.82    COVID-19 U07.1    Abnormal EKG R94.31    HLD (hyperlipidemia) E78.5     Past Medical History:   Diagnosis Date    Carotid artery stenosis, asymptomatic, right     Cervical disc disorder at C5-C6 level with radiculopathy 02/22/2019    Cervical radiculopathy - s/p corrective surgery, resolved 06/25/2010    HLD (hyperlipidemia)      Surgical History  Past  Surgical History:   Procedure Laterality Date    Carotid endarterectomy Right 07/19/2015    INGUINAL HERNIA REPAIR Right 2010    NECK SURGERY  01/2019    for C5-C6 nerve root impingement     Family History  Family History   Problem Relation Age of Onset    Other Father         Carotid artery stenosis s/p BCEA    Lung cancer Father     Stroke Neg Hx     Heart attack Neg Hx     Heart surgery Neg Hx     Diabetes Neg Hx      Social History  Social History     Socioeconomic History    Marital status: Married     Spouse name: Not on file    Number of children: Not on file    Years of education: Not on file    Highest  education level: Not on file   Occupational History    Not on file   Tobacco Use    Smoking status: Never Smoker    Smokeless tobacco: Never Used   Substance and Sexual Activity    Alcohol use: Yes     Alcohol/week: 5.0 standard drinks     Types: 5 Standard drinks or equivalent per week    Drug use: No    Sexual activity: Not on file   Social History Narrative    Not on file       Allergies  Allergies   Allergen Reactions    Ampicillin Rash     In childhood; denies anaphylaxis    No Known Latex Allergy      Home Medications    No current facility-administered medications on file prior to encounter.     Current Outpatient Medications on File Prior to Encounter   Medication Sig Dispense Refill    acetaminophen (TYLENOL) 500 mg tablet Take 1,000 mg by mouth every 6 hours as needed      rosuvastatin (CRESTOR) 10 MG tablet TAKE 1 TABLET BY MOUTH EVERY DAY 30 tablet 11    sildenafil (REVATIO) 20 MG tablet Take 40-60 mg by mouth daily as needed        testosterone cypionate (DEPO-TESTOSTERONE) 200 MG/ML injection Inject 400 mg into the muscle  Every 2.5 weeks  0    aspirin 81 MG EC tablet Take 81 mg by mouth every morning         ibuprofen (ADVIL,MOTRIN) 200 MG tablet Take 600 mg by mouth every 6 hours as needed         EXAM   Vital Signs:  Vitals:    10/16/19 0119 10/16/19 0133 10/16/19 0140 10/16/19 0425   BP: 146/74   (!) 138/97   BP Location: Left arm   Left arm   Pulse: 95   79   Resp: 20   24   Temp: 36.9 C (98.5 F)   37.2 C (98.9 F)   TempSrc: Oral   Oral   SpO2: 90% (!) 86% 92% 99%   Weight:       Height:         Physical Exam  Vitals and nursing note reviewed.   Constitutional:       General: He is not in acute distress.     Appearance: He is not ill-appearing, toxic-appearing or diaphoretic.   HENT:      Mouth/Throat:      Mouth: Mucous membranes are moist.  Pharynx: No oropharyngeal exudate or posterior oropharyngeal erythema.   Eyes:      General: No scleral icterus.     Extraocular  Movements: Extraocular movements intact.      Pupils: Pupils are equal, round, and reactive to light.   Cardiovascular:      Rate and Rhythm: Normal rate and regular rhythm.      Heart sounds: No murmur heard.  No friction rub. No gallop.    Pulmonary:      Effort: Pulmonary effort is normal. Tachypnea present. No respiratory distress.      Breath sounds: No stridor. No wheezing, rhonchi or rales.      Comments: Increased work of breathing, coughing during exam, deep breaths trigger coughing fits so breathing shallow  Abdominal:      General: Bowel sounds are normal. There is no distension.      Palpations: Abdomen is soft.      Tenderness: There is no abdominal tenderness. There is no right CVA tenderness, left CVA tenderness, guarding or rebound.   Musculoskeletal:      Cervical back: Neck supple. No rigidity.      Right lower leg: No edema.      Left lower leg: No edema.   Skin:     General: Skin is warm and dry.      Coloration: Skin is not jaundiced.   Neurological:      General: No focal deficit present.      Mental Status: He is alert and oriented to person, place, and time.       DATA   Lab Results:    CMP:    Recent Labs   Lab 10/16/19  0625 10/15/19  1733   Sodium 138 137   Potassium 4.9* 4.2   Chloride 103 103   CO2 22 22   Anion Gap 13 12   UN 18 16   Creatinine 0.96 1.10   GFR,Caucasian 84 71   GFR,Black 97 82   Glucose 142* 108*   Calcium 8.6 8.5*   Total Protein 7.2 7.2   Albumin 3.0* 3.5   Bilirubin,Total 0.3 0.4   AST 133* 131*   ALT 130* 110*       CBC:    Recent Labs   Lab 10/16/19  0622 10/15/19  1733   WBC 4.5 7.3   RBC 5.7 5.1   Hemoglobin 17.7* 16.1   Hematocrit 56* 49   MCV 98* 95*   RDW 12.2 12.2   Platelets 207 212       Coag:    Recent Labs   Lab 10/16/19  0622   aPTT 30.7   Protime 11.4   INR 1.0       UA: No results for input(s): UCOL, UAPP, UAGLU, USG, UBLD, UPRO, UNITR, URBC, UWBC, UGRAN, [place], UMUC, ULEU in the last 48 hours.    No components found with this basename: UKET,  UPH    Troponin:   Recent Labs   Lab 10/16/19  0625   TROP T 0 HR High Sensitivity 8        Micro:       Radiology impressions (last 3 days):  *Chest STANDARD single view    Result Date: 10/15/2019  Bilateral mid to lower lung patchy airspace opacities, concerning for infection. Clinical correlation is recommended. END OF IMPRESSION [facility] submits this DICOM format image data and final report to the [facility], an independent secure electronic health information exchange, on a reciprocally  searchable basis (with patient authorization) for a minimum of 12 months after exam date.    Is/Os:No intake or output data in the 24 hours ending 10/16/19 0754 Net IO Since Admission: No IO data has been entered for this period [10/16/19 0754]  CURRENT HOSPITAL MEDICATION ORDERS     Scheduled Meds:   pantoprazole  40 mg Oral QAM    [START ON 10/17/2019] zinc sulfate  220 mg Oral Daily    [START ON 10/17/2019] cholecalciferol  2,000 units Oral Daily    remdesivir  200 mg Intravenous Once    Followed by    Melene Muller ON 10/17/2019] remdesivir  100 mg Intravenous Daily @ 1800    aspirin  81 mg Oral QAM    enoxaparin  40 mg Subcutaneous Daily @ 2100    dexamethasone  6 mg Intravenous Q24H     Continuous Infusions:  PRN Meds: ibuprofen, acetaminophen **OR** acetaminophen, albuterol HFA, ipratropium-albuterol, benzonatate, guaiFENesin-codeine, docusate sodium, magnesium hydroxide, ondansetron, metoclopramide   A/P     64 y.o. male with medical problems as outlined below presenting with hypoxia in the setting of COVID-19 infection.    Acute Hypoxic Respiratory Failure  Pneumonia due to COVID-19 Infection  Not Vaccinated (Probable Prior COVID Infection 02/2018, No Confirmatory Testing Was Available)  Mild diffuse interstitial opacities on CXR. Hypoxic to 87% on RA at home, 86% on 2L here, recovered with 2-4 L NC.   Start decadron  IV x1 then  daily x9   Start remdesevir  IV x1 then  daily x 4 days   (will need  specialist approval in AM)   Vitamin D 2000 daily, zinc sulfate  QD   Check routine COVID labs:   LDH, ferritin, CRP, CK, TGs   d-dimer, INR, PTT   trops, BNP, LA   procal   Albuterol HFA prn, duoneb prn severe symptoms not responsive to HFA   Benzonatate, guaifenesin-codeine prn cough   Continuous pulse oximetry   Holding on telemetry as currently no beds available, if worsens will require it but for right now continuous pulse oximetry should be sufficient   Incentive spirometry q1h while awake   Advised to self prone or lay on side as much as able    NSAID Induced Nausea  No symptoms of GI bleed   pantop daily for now   Shouldn't need to continue long term    CODE STATUS:  Discussed with patient - full code   If he were to worsen he does want intubation   Verbally designates wife Corrie Dandy [phone] as decision maker should he become unable   I did briefly introduce worst case scenarios should he decline and require intubation. Informed him if that happens there's a chance of tracheostomy. He was hesitant to say he'd want this, but ultimately doesn't know what he'd want.    Chronic Medical Problems:    R ICA stenosis s/p R CEA 6/28    Bilateral carotid artery disease    HLD (hyperlipidemia)    Continue Home: aspirin  Hold Home: crestor, testosterone injection    Home Medication List Status  -Has the home medication list been verified at time of admission? --> YES   Home medication list HAS BEEN CONFIRMED by PHARMACY        DVT ppx: Lovenox  Fluid: PO prn  Diet: *Regular  D/T/L: PIV  Mobility Plan / Ability: N/A  Type and Frequency of Labs: daily + as stated above    Code Status: Full Code (  confirmed with patient)      Author:   Tarri Glenn, DO    Pager: [phone])      Admission status: INPATIENT  Anticipated LOS: >5 days  Clinical reasoning: I anticipate care of this patient to require greater than 2 midnights based on the current clinical presentation and concurrent comorbid diagnosis  and in my clinical judgment (as detailed above) should be INPATIENT status   Condition: Guarded

## 2019-10-17 LAB — CBC AND DIFFERENTIAL
Baso # K/uL: 0 10*3/uL (ref 0.0–0.1)
Basophil %: 0 %
Eos # K/uL: 0 10*3/uL (ref 0.0–0.5)
Eosinophil %: 0 %
Hematocrit: 47 % (ref 40–51)
Hemoglobin: 15.2 g/dL (ref 13.7–17.5)
IMM Granulocytes #: 0.1 10*3/uL — ABNORMAL HIGH (ref 0.0–0.0)
IMM Granulocytes: 0.7 %
Lymph # K/uL: 0.7 10*3/uL — ABNORMAL LOW (ref 1.3–3.6)
Lymphocyte %: 7.8 %
MCH: 31 pg (ref 26–32)
MCHC: 32 g/dL (ref 32–37)
MCV: 97 fL — ABNORMAL HIGH (ref 79–92)
Mono # K/uL: 0.5 10*3/uL (ref 0.3–0.8)
Monocyte %: 6.3 %
Neut # K/uL: 7.2 10*3/uL — ABNORMAL HIGH (ref 1.8–5.4)
Nucl RBC # K/uL: 0 10*3/uL (ref 0.0–0.0)
Nucl RBC %: 0 /100 WBC (ref 0.0–0.2)
Platelets: 271 10*3/uL (ref 150–330)
RBC: 4.9 MIL/uL (ref 4.6–6.1)
RDW: 12.4 % (ref 11.6–14.4)
Seg Neut %: 85.2 %
WBC: 8.5 10*3/uL (ref 4.2–9.1)

## 2019-10-17 LAB — EKG 12-LEAD
P: 37 deg
PR: 180 ms
QRS: 67 deg
QRSD: 102 ms
QT: 458 ms
QTc: 443 ms
Rate: 56 {beats}/min
T: 95 deg

## 2019-10-17 LAB — COMPREHENSIVE METABOLIC PANEL
ALT: 108 U/L — ABNORMAL HIGH (ref 0–50)
AST: 85 U/L — ABNORMAL HIGH (ref 0–50)
Albumin: 3 g/dL — ABNORMAL LOW (ref 3.5–5.2)
Alk Phos: 38 U/L — ABNORMAL LOW (ref 40–130)
Anion Gap: 11 (ref 7–16)
Bilirubin,Total: 0.3 mg/dL (ref 0.0–1.2)
CO2: 23 mmol/L (ref 20–28)
Calcium: 8.3 mg/dL — ABNORMAL LOW (ref 8.6–10.2)
Chloride: 106 mmol/L (ref 96–108)
Creatinine: 0.86 mg/dL (ref 0.67–1.17)
GFR,Black: 107 *
GFR,Caucasian: 92 *
Glucose: 131 mg/dL — ABNORMAL HIGH (ref 60–99)
Lab: 17 mg/dL (ref 6–20)
Potassium: 4.8 mmol/L — ABNORMAL HIGH (ref 3.4–4.7)
Sodium: 140 mmol/L (ref 133–145)
Total Protein: 6.6 g/dL (ref 6.3–7.7)

## 2019-10-17 LAB — COVID-19 NAAT (PCR): COVID-19 NAAT (PCR): POSITIVE — AB

## 2019-10-17 LAB — RSV PCR: RSV PCR: 0

## 2019-10-17 LAB — INFLUENZA A: Influenza A PCR: 0

## 2019-10-17 LAB — COVID-19 PCR

## 2019-10-17 LAB — GRAM STAIN

## 2019-10-17 LAB — INFLUENZA B PCR: Influenza B PCR: 0

## 2019-10-17 MED ORDER — ROSUVASTATIN CALCIUM 5 MG PO TABS *I*
10.0000 mg | ORAL_TABLET | Freq: Every day | ORAL | Status: DC
Start: 2019-10-17 — End: 2019-10-18
  Administered 2019-10-17 – 2019-10-18 (×2): 10 mg via ORAL
  Filled 2019-10-17 (×2): qty 2

## 2019-10-17 NOTE — Progress Notes (Signed)
Daily Progress Note  Hospital Medicine      Patient Name: Alejandro Dennis  Date of Birth: 01/18/1956  MRN#: [id number]  Admission Date: 10/15/2019  Date of Service: 10/17/2019    Interval History  Patient seen and examined, reviewed event and data   Overall feeling better and upset about being in the H  Had a bad coughing spell through the night  The sputum color is decreasing in purulence being now light green  Has been drinking enough and tolerating prone position better   r of s x 10 is otherwise negative   Physical Exam:  Vital Signs:  BP 154/86 (BP Location: Left arm)    Pulse 72    Temp 36.9 C (98.4 F) (Oral)    Resp 17    Ht 1.727 m ('5\' 8"' )    Wt 74.8 kg (165 lb)    SpO2 96%    BMI 25.09 kg/m    Last fever on the 25th  General: Alert in no apparent distress.Very good general conditions   HEENT: Moist mucosa  no thrush or exudate                 Pupils equally round, EOMI.                sclera normal  Neck: supple    Cardiovascular: NS1S2 . no murmurs or JVP   Pulmonary/Chest: Clear to auscultation bilaterally. No crackles or wheezes.  Abdominal: Soft. Nontender. Nondistended. No organomegaly   Extremity: no edema.  Neurologic: alert and oriented . No focal deficits.  Skin  no new rash or lesion     Current MEDS    Scheduled                                                                                                                                         rosuvastatin  10 mg Oral Daily    pantoprazole  40 mg Oral QAM    zinc sulfate  220 mg Oral Daily    cholecalciferol  2,000 units Oral Daily    cefTRIAXone  2,000 mg Intravenous Q24H    azithromycin  500 mg Intravenous Q24H    aspirin  81 mg Oral QAM    enoxaparin  40 mg Subcutaneous Daily @ 2100    dexamethasone  6 mg Intravenous Q24H                                                                                             Infusions  Lab Results:  Recent Labs   Lab 10/17/19  0408 10/16/19  0622 10/15/19  1733   WBC 8.5 4.5 7.3   Hemoglobin  15.2 17.7* 16.1   Hematocrit 47 56* 49   Platelets 271 207 212     Recent Labs   Lab 10/17/19  0408 10/16/19  0625 10/15/19  1733   Sodium 140 138 137   Potassium 4.8* 4.9* 4.2   Chloride 106 103 103   CO2 '23 22 22   ' UN '17 18 16   ' Creatinine 0.86 0.96 1.10   Calcium 8.3* 8.6 8.5*   Albumin 3.0* 3.0* 3.5   Total Protein 6.6 7.2 7.2   Bilirubin,Total 0.3 0.3 0.4   Alk Phos 38* 42 40   ALT 108* 130* 110*   AST 85* 133* 131*   Glucose 131* 142* 108*     Radiology impressions (last 3 days):  EKG 12 lead    Result Date: 10/17/2019  Sinus rhythm Nonspecific T abnormalities, diffuse leads Compared to prior, findings are similar    EKG 12 lead (initial)    Result Date: 10/16/2019  Incomplete analysis due to missing data in precordial lead(s) Sinus rhythm PVC Diffuse nonspecific ST-T changes. Compared to July 09, 2018, ST-T changes are new, cannot exclude ischemia.    *Chest STANDARD single view    Result Date: 10/15/2019  Bilateral mid to lower lung patchy airspace opacities, concerning for infection. Clinical correlation is recommended. END OF IMPRESSION [facility] submits this DICOM format image data and final report to the [facility], an independent secure electronic health information exchange, on a reciprocally searchable basis (with patient authorization) for a minimum of 12 months after exam date.         EXERCISE STRESS ECHO LIMITED (ISCHEMIA ONLY) 09/02/2018    Interpretation Summary   No evidence of myocardial ischemia by EKG or echo criteria.   The blood pressure response to exercise was hypertensive.   Normal resting LV ejection fraction and wall motion.   The patient's functional capacity is normal.    Hospital Problems  Active Hospital Problems    Diagnosis    Pneumonia due to COVID-19 virus    COVID-19    HLD (hyperlipidemia)    Bilateral carotid artery disease    R ICA stenosis s/p R CEA 6/28      Resolved Hospital Problems   No resolved problems to display.      Assessment and Plan:  64  y.o.malevery active and independent, lifetime non smoker, with medical hx significant for:HLD (hyperlipidemia) and PVDR ICA stenosis s/p R CEA 6/28Bilateral carotid artery disease  presenting withhypoxia in the setting of COVID diagnosis 10/04/19. Symptoms started 10/03/19 with severe achiness / generalize arthralgias. Symptoms have been worsening  include coughing fits, dyspnea, tachypnea with talking, inability to take deep breaths due to coughing, occasional brown-green, that now is clearing after one day of antibiotics, He was running a temp now has subsided,  Also had few episodes of loose stool and decreased sense of taste / smell   Patient likely had COVID in 02/2018 but testing was not available at the time. For this reason he did not get vaccinated.     covid pneumonitis  Acute hypoxemic respiratory failure  Today on medium high flow Sat 96% and requested to taper to Sat of 90%  Now 14 days after start of symptoms  And on decadron 76m (on PPI) and Ramdesevir discontinued on the 25th after discussion with Dr WArthor Captain(elevation  of the LFT)  Coughing spell last evening  Sputum clearing after being place of antibiotics for superimposed bronchitis   CXR Bilateral mid to lower lung patchy airspace opacities,  Febrile and blood cultures negative   Has a low pro calcitonin  MRSA negative   Elevated CRP, and DD    Has no leucocytosis or shift   On Vit D  And Zinc     Hemo concentrated on admission now improved and fluids discontinued   Has a low NT     ASHD  HLD continue statin,   PVD continue ASA   EKG  RSR multifocal VE T wave abnormalities lateral leads   One trop negative   Continue ASA     mild elevation of the LFT improving     dvt   Enoxaparin     Anticipate discharge home in the next days     Author:   Delton See, MD   10/17/2019  2:39 PM

## 2019-10-18 LAB — NT-PRO BNP: NT-pro BNP: 302 pg/mL (ref 0–900)

## 2019-10-18 LAB — CBC AND DIFFERENTIAL
Baso # K/uL: 0 10*3/uL (ref 0.0–0.1)
Basophil %: 0.1 %
Eos # K/uL: 0 10*3/uL (ref 0.0–0.5)
Eosinophil %: 0 %
Hematocrit: 48 % (ref 40–51)
Hemoglobin: 15.2 g/dL (ref 13.7–17.5)
IMM Granulocytes #: 0.1 10*3/uL — ABNORMAL HIGH (ref 0.0–0.0)
IMM Granulocytes: 0.6 %
Lymph # K/uL: 0.8 10*3/uL — ABNORMAL LOW (ref 1.3–3.6)
Lymphocyte %: 10.7 %
MCH: 31 pg (ref 26–32)
MCHC: 32 g/dL (ref 32–37)
MCV: 96 fL — ABNORMAL HIGH (ref 79–92)
Mono # K/uL: 0.5 10*3/uL (ref 0.3–0.8)
Monocyte %: 6.9 %
Neut # K/uL: 6.3 10*3/uL — ABNORMAL HIGH (ref 1.8–5.4)
Nucl RBC # K/uL: 0 10*3/uL (ref 0.0–0.0)
Nucl RBC %: 0 /100 WBC (ref 0.0–0.2)
Platelets: 288 10*3/uL (ref 150–330)
RBC: 5 MIL/uL (ref 4.6–6.1)
RDW: 12.1 % (ref 11.6–14.4)
Seg Neut %: 81.7 %
WBC: 7.8 10*3/uL (ref 4.2–9.1)

## 2019-10-18 LAB — UNABLE TO PERFORM ADD-ON TESTING 1

## 2019-10-18 MED ORDER — DEXAMETHASONE 6 MG PO TABS *I*
6.0000 mg | ORAL_TABLET | Freq: Every day | ORAL | 0 refills | Status: AC
Start: 2019-10-18 — End: 2019-10-25

## 2019-10-18 MED ORDER — CEFDINIR 300 MG PO CAPS *I*
300.0000 mg | ORAL_CAPSULE | Freq: Two times a day (BID) | ORAL | 0 refills | Status: AC
Start: 2019-10-18 — End: 2019-10-23

## 2019-10-18 MED ORDER — LISINOPRIL 5 MG PO TABS *I*
5.0000 mg | ORAL_TABLET | Freq: Every day | ORAL | Status: DC
Start: 2019-10-18 — End: 2019-10-18
  Administered 2019-10-18: 5 mg via ORAL
  Filled 2019-10-18: qty 1

## 2019-10-18 MED ORDER — ZINC SULFATE 220 MG PO CAPS *I*
220.0000 mg | ORAL_CAPSULE | Freq: Every day | ORAL | 0 refills | Status: AC
Start: 2019-10-19 — End: 2019-10-29

## 2019-10-18 MED ORDER — DEXAMETHASONE SODIUM PHOSPHATE 4 MG/ML INJ SOLN *WRAPPED*
6.0000 mg | INTRAMUSCULAR | 0 refills | Status: DC
Start: 2019-10-18 — End: 2019-10-18

## 2019-10-18 MED ORDER — CHOLECALCIFEROL 1000 UNIT PO CAPS *WRAPPED*
2000.0000 [IU] | ORAL_CAPSULE | Freq: Every day | ORAL | 0 refills | Status: AC
Start: 2019-10-19 — End: 2019-10-29

## 2019-10-18 MED ORDER — PANTOPRAZOLE SODIUM 40 MG PO TBEC *I*
40.0000 mg | DELAYED_RELEASE_TABLET | Freq: Every morning | ORAL | 0 refills | Status: AC
Start: 2019-10-19 — End: 2019-10-29

## 2019-10-18 NOTE — Progress Notes (Signed)
Reviewed discharge instruction with patient , questions answered ,pt feels ready to go home today

## 2019-10-18 NOTE — Discharge Summary (Signed)
Discharge Summary      Patient Name: Alejandro Dennis  Date of Birth: [birth year]  MRN#: [id number]  Admission Date: 10/15/2019  Date of Service: 10/18/2019    Primary Discharge Diagnoses: Pneumonia due to COVID-19 virus    Secondary Diagnoses:  Active Hospital Problems    Diagnosis    Pneumonia due to COVID-19 virus    COVID-19    HLD (hyperlipidemia)    Bilateral carotid artery disease    R ICA stenosis s/p R CEA 6/28      Resolved Hospital Problems   No resolved problems to display.     Hospital Course:   64 y.o.malevery active and independent, lifetime non smoker,with medical hx significant for:HLD (hyperlipidemia)and PVDR ICA stenosis s/p R CEA 6/28Bilateral carotid artery disease  presenting withhypoxia in the setting of COVID diagnosis 10/04/19. Symptoms started 9/12/21with severe achiness / generalize arthralgias. Symptoms had been worsening include coughing fits, dyspnea, tachypnea with talking, inability to take deep breaths due to coughing, occasional brown-green, all improving.  He was running a temp now has subsided, Also had few episodes of loose stool and decreased sense of taste / smell   Patient likely had COVID in 02/2018 but testing was not available at the time. For this reason he did not get vaccinated.    covid pneumonitis  Acute hypoxemic respiratory failure  Discharge on 2l of o2   And  decadron 56m (on PPI) and to complete a course of 10 days    Ramdesevir discontinued on the 25th after discussion with Dr WArthor Captain(elevation of the LFT that is improving)  Sputum with occasional purulent expectoration despite antibiotics no other evidence for bacterial infection and to complete a course    CXRBilateral mid to lower lung patchy airspace opacities,  Febrile on the 25th  and blood cultures negative   Has a low pro calcitonin  MRSA negative   Elevated CRP, and DD very mild elevation of the LDH elevated ferritin   Has no leucocytosis or shift   COVID 19 + negative influenza and RSV    Low NT  On Vit D  And Zinc     Hemo concentrated on admission now improved and fluids discontinued   Has a low NT     ASHD  HLD continue statin,   PVD continue ASA   EKG  RSR multifocal VE T wave abnormalities lateral leads   One trop negative   HTN and continue strict control     mild elevation of the LFT improving      discharge home     Discharge Exam:  BP: (128-166)/(78-100)   Temp:  [36.3 C (97.3 F)-37 C (98.6 F)]   Temp src: Temporal (09/27 1318)  Heart Rate:  [54-77]   Resp:  [18-22]   SpO2:  [85 %-97 %]   Vitals:    10/18/19 1318   BP: 128/78   Pulse: 66   Resp: 18   Temp: 36.3 C (97.3 F)   Weight:    Height:    on 2l of NC   General: Pleasant and cooperative. In no apparent distress.  HEENT: Moist mucosa                  Pupils equally round, sclera normal. EOMI.  Cardiovascular: NS1 S2 No murmur  Pulmonary/Chest: Clear to auscultation bilaterally. No Rales or wheezing  Abdominal: Soft. Nontender. Nondistended. Normoactive bowel sounds   Extremity: No edema.  Neurologic: Alert and oriented  No focal deficit.  Radiology:  EKG 12 lead    Result Date: 10/17/2019  Sinus rhythm Nonspecific T abnormalities, diffuse leads Compared to prior, findings are similar    EKG 12 lead (initial)    Result Date: 10/16/2019  Incomplete analysis due to missing data in precordial lead(s) Sinus rhythm PVC Diffuse nonspecific ST-T changes. Compared to July 09, 2018, ST-T changes are new, cannot exclude ischemia.    *Chest STANDARD single view    Result Date: 10/15/2019  Bilateral mid to lower lung patchy airspace opacities, concerning for infection. Clinical correlation is recommended. END OF IMPRESSION [facility] submits this DICOM format image data and final report to the [facility], an independent secure electronic health information exchange, on a reciprocally searchable basis (with patient authorization) for a minimum of 12 months after exam date.          EXERCISE STRESS ECHO LIMITED (ISCHEMIA ONLY)  09/02/2018    Interpretation Summary   No evidence of myocardial ischemia by EKG or echo criteria.   The blood pressure response to exercise was hypertensive.   Normal resting LV ejection fraction and wall motion.   The patient's functional capacity is normal.    Labs:  Recent Labs   Lab 10/18/19  0407 10/17/19  0408 10/16/19  0622   WBC 7.8 8.5 4.5   Hemoglobin 15.2 15.2 17.7*   Hematocrit 48 47 56*   Platelets 288 271 207     Recent Labs   Lab 10/17/19  0408 10/16/19  0625 10/15/19  1733   Sodium 140 138 137   Potassium 4.8* 4.9* 4.2   Chloride 106 103 103   CO2 _0 UN _1 Creatinine 0.86 0.96 1.10   Calcium 8.3* 8.6 8.5*   Albumin 3.0* 3.0* 3.5   Total Protein 6.6 7.2 7.2   Bilirubin,Total 0.3 0.3 0.4   Alk Phos 38* 42 40   ALT 108* 130* 110*   AST 85* 133* 131*   Glucose 131* 142* 108*       Pending tests on discharge:     Pending Labs Upon Discharge     Order Current Status    Aerobic culture Preliminary result    Blood culture Preliminary result    Blood culture Preliminary result    Legionella culture Preliminary result      Laboratory Tests Pending Results     Order Current Status    Aerobic culture Preliminary result    Blood culture Preliminary result    Blood culture Preliminary result    Legionella culture Preliminary result          Disposition: home     Patient Instructions:   Current Discharge Medication List      START taking these medications    Details AM Noon PM Bedtime   cefdinir (OMNICEF) 300 MG capsule Take 1 capsule (300 mg total) by mouth every 12 hours for 5 days  Qty: 10 capsule, Refills: 0              zinc sulfate (ZINCATE) 220 (50 Zn) MG capsule Take 1 capsule (220 mg total) by mouth daily for 10 days  Qty: 10 capsule, Refills: 0              pantoprazole (PROTONIX) 40 MG EC tablet Take 1 tablet (40 mg total) by mouth every morning for 10 days  Swallow whole. Do not crush, break, or chew.  Qty: 10 tablet, Refills: 0  cholecalciferol (VITAMIN D) 1000 UNIT  capsule Take 2 capsules (2,000 units total) by mouth daily for 10 days  Qty: 20 capsule, Refills: 0              dexAMETHasone (DECADRON) 6 MG tablet Take 1 tablet (6 mg total) by mouth daily (with breakfast) for 7 days  Qty: 7 tablet, Refills: 0        Comments: Patient discharged from unit FFT 3 WEST                 CONTINUE these medications which have NOT CHANGED    Details AM Noon PM Bedtime   acetaminophen (TYLENOL) 500 mg tablet Take 1,000 mg by mouth every 6 hours as needed              rosuvastatin (CRESTOR) 10 MG tablet TAKE 1 TABLET BY MOUTH EVERY DAY  Qty: 30 tablet, Refills: 11        Associated Diagnoses: Hyperlipidemia, unspecified hyperlipidemia type              sildenafil (REVATIO) 20 MG tablet Take 40-60 mg by mouth daily as needed                testosterone cypionate (DEPO-TESTOSTERONE) 200 MG/ML injection Inject 400 mg into the muscle  Every 2.5 weeks  Refills: 0              aspirin 81 MG EC tablet Take 81 mg by mouth every morning                 ibuprofen (ADVIL,MOTRIN) 200 MG tablet Take 600 mg by mouth every 6 hours as needed                       Discharge Instruction:           Discharge Instructions       You came to the hospital with shortness of breath several days into a diagnosis of COVID 19, being unvaccinated, you are slowly recovering, keep your Saturations at 90%,  You are to complete a course of Decadron and antibiotics of CAP   You should get vaccinated as soon as possible   Please follow your blood pressure goal 130 mmHG     Follow-up with PCP in  3-5 days     Activity: as tolerated   Diet: low fat low NA     Time spent in discharg summary 35  minutes.    SignedDelton See, MD  10/18/2019  1:59 PM

## 2019-10-18 NOTE — Progress Notes (Signed)
Room air sat at res is 86 %    Amb oxygen sat on oxygen  Os 92% on 2l/min

## 2019-10-18 NOTE — Progress Notes (Addendum)
Guidelines for Oxygen Qualification:    Patients must be 88% or below at rest on room air. If oxygen is greater than 88% on room air at rest, ambulate the patient on room air.    When ambulating the patient, document oxygen saturation. If pt is 88% or below while ambulating on room air, place oxygen on the pt.     Document required liter flow and oxygen saturation during ambulation.     What RN's note should look like when complete: (No ranges or insurance will not accept information)    Patient's Sp02 on room air at rest is __85______%. Patient's Sp02 at rest on __2___ liters is __93____%.    Patient's Sp02 with ambulation on room air is ______%. Patient's Sp02 with ambulation on _2___ liters NC is __92__%.

## 2019-10-18 NOTE — Discharge Instructions (Signed)
You came to the hospital with shortness of breath several days into a diagnosis of COVID 19, being unvaccinated, you are slowly recovering, keep your Saturations at 90%,  You are to complete a course of Decadron and antibiotics of CAP   You should get vaccinated as soon as possible   Please follow your blood pressure goal 130 mmHG

## 2019-10-18 NOTE — Plan of Care (Signed)
Problem: Safety  Goal: Patient will remain free of falls  Outcome: Progressing towards goal     Problem: Pain/Comfort  Goal: Patient's pain or discomfort is manageable  Outcome: Progressing towards goal     Problem: Nutrition  Goal: Patient's nutritional status is maintained or improved  Outcome: Progressing towards goal     Problem: Mobility  Goal: Patient's functional status is maintained or improved  Outcome: Progressing towards goal     Problem: Psychosocial  Goal: Demonstrates ability to cope with illness  Outcome: Progressing towards goal     Problem: Cognitive function  Goal: Cognitive function will be maintained or return to baseline  Outcome: Progressing towards goal     Problem: COVID-19 PREVENTION AND MONITORING  Goal: Patient will show no signs or symptoms of COVID-19  Description: 1. Monitor frequently for potential symptoms including fever and respiratory symptoms.  2. Educate patient, family, patient representatives, staff and visitors of COVID-19 signs and symptoms and precautions.  3. Remind patients to report if they feel feverish or have symptoms of respiratory infection.  4. Reinforce no visitor policy, except for certain compassionate care situations.  5. When visitors are necessary and meet exception to no visitor policy, limit to a specified room, encourage social distancing with no handshakes, physical contact, and remaining 6 feet apart.  6. Update patient, family, and patient representatives as needed.  Outcome: Progressing towards goal     Problem: COVID-19 Confirmed/Rule Out Diagnosis  Goal: Patient will remain free of complications due to COVID-19  Description: 1. Review and update the patient's isolation status in the isolation activity.  2. Keep the patient's door closed at all times and limit movement of the patient outside of the room to medically essential purposes.  3. Educate and reinforce infection prevention and control practices recommended by CDC and hospital policy.  4.  Frequently monitor for development of more severe symptoms.  5. Use appropriate PPE when providing care for patient.  6. Reinforce no visitor policy, except for certain compassionate care situations.  7. Update patient/patient family and patient representatives as needed.      Outcome: Progressing towards goal     Problem: COVID-19 ISOLATION PSYCHOSOCIAL WELLBEING  Goal: Patient will not show a decline in psychosocial wellbeing or experience adverse effects through next review  Description: 1. Educate patient, family and patient representatives, staff and visitors of COVID-19 signs and symptoms and precautions.  2. Provide emotional support and allow patient to express feelings, fears, and concerns.  3. Observe for psychological and mental status changes, document and update social services and MD as needed.  4. Provide in room activities of choice, if indicated.  5. Provide alternative methods of communication with family/visitors point about technology.  6. Assure patient, family and patient representatives facility is taking precautions to keep them safe.  7. Update patient, family and patient representatives as needed.  Outcome: Progressing towards goal

## 2019-10-19 LAB — AEROBIC CULTURE: Aerobic Culture: 0

## 2019-10-21 LAB — BLOOD CULTURE: Bacterial Blood Culture: 0

## 2019-10-22 LAB — BLOOD CULTURE: Bacterial Blood Culture: 0

## 2019-10-25 LAB — LEGIONELLA CULTURE: Legionella Culture: 0

## 2019-10-28 ENCOUNTER — Other Ambulatory Visit: Payer: Self-pay | Admitting: Vascular Surgery

## 2019-10-28 DIAGNOSIS — I6523 Occlusion and stenosis of bilateral carotid arteries: Secondary | ICD-10-CM

## 2019-11-08 ENCOUNTER — Inpatient Hospital Stay: Admit: 2019-11-08

## 2019-11-10 ENCOUNTER — Ambulatory Visit
Admission: RE | Admit: 2019-11-10 | Discharge: 2019-11-10 | Disposition: A | Discharge status: Home / Self Care | Source: Ambulatory Visit | Attending: Vascular Surgery | Admitting: Vascular Surgery

## 2019-11-10 DIAGNOSIS — I6522 Occlusion and stenosis of left carotid artery: Secondary | ICD-10-CM | POA: Insufficient documentation

## 2019-11-10 DIAGNOSIS — I6523 Occlusion and stenosis of bilateral carotid arteries: Secondary | ICD-10-CM | POA: Insufficient documentation

## 2019-11-10 LAB — CV US CAROTID BILATERAL
Left Carotid Bulb EDV: 15.41 cm/s
Left Carotid Bulb PSV: 89.22 cm/s
Left Common Carotid Artery EDV Dist: 23.18 cm/s
Left Common Carotid Artery EDV Prox: 15.41 cm/s
Left Common Carotid Artery PSV Dist: 77.56 cm/s
Left Common Carotid Artery PSV Prox: 74.97 cm/s
Left External Carotid Artery EDV: 27.73 cm/s
Left External Carotid Artery PSV: 169.85 cm/s
Left ICA/CCA Ratio: 1.07
Left Internal Carotid Artery EDV Dist: 27.43 cm/s
Left Internal Carotid Artery EDV Mid: 29.4 cm/s
Left Internal Carotid Artery EDV Prox: 29.4 cm/s
Left Internal Carotid Artery PSV Dist: 66.89 cm/s
Left Internal Carotid Artery PSV Mid: 64.91 cm/s
Left Internal Carotid Artery PSV Prox: 82.67 cm/s
Left Subclavian Artery EDV: 0 cm/s
Left Subclavian Artery PSV: 175.29 cm/s
Left Vertebral Artery EDV: 8.64 cm/s
Left Vertebral Artery PSV: 26.96 cm/s
Right Carotid Bulb EDV: 15.27 cm/s
Right Carotid Bulb PSV: 50.43 cm/s
Right Common Carotid Artery EDV Dist: 20.77 cm/s
Right Common Carotid Artery EDV Prox: 19.67 cm/s
Right Common Carotid Artery PSV Dist: 79 cm/s
Right Common Carotid Artery PSV Prox: 74.6 cm/s
Right External Carotid Artery EDV: 23.07 cm/s
Right External Carotid Artery PSV: 162.86 cm/s
Right ICA/CCA Ratio: 0.79
Right Internal Carotid Artery EDV Dist: 24.46 cm/s
Right Internal Carotid Artery EDV Mid: 21.87 cm/s
Right Internal Carotid Artery EDV Prox: 23.18 cm/s
Right Internal Carotid Artery PSV Dist: 69.75 cm/s
Right Internal Carotid Artery PSV Mid: 64.58 cm/s
Right Internal Carotid Artery PSV Prox: 62.02 cm/s
Right Subclavian Artery EDV: 0 cm/s
Right Subclavian Artery PSV: 192.4 cm/s
Right Vertebral Artery EDV: 10.38 cm/s
Right Vertebral Artery PSV: 41.79 cm/s

## 2019-11-22 ENCOUNTER — Telehealth: Payer: Self-pay | Admitting: Cardiology

## 2019-11-22 NOTE — Telephone Encounter (Signed)
Lab results

## 2019-11-23 ENCOUNTER — Other Ambulatory Visit: Payer: Self-pay

## 2019-11-23 DIAGNOSIS — R251 Tremor, unspecified: Secondary | ICD-10-CM

## 2019-11-23 MED ORDER — PROPRANOLOL HCL 10 MG PO TABS *I*
10.0000 mg | ORAL_TABLET | Freq: Two times a day (BID) | ORAL | 3 refills | Status: DC
Start: 2019-11-23 — End: 2020-03-23

## 2019-11-23 NOTE — Telephone Encounter (Signed)
Spoke w pt. Pt states that he is indeed taking amlodipine 5 mg daily. Pt states that he would consider switching to propranolol, but is concerned because he does not want a BB to effect his workouts. Pt states that he runs and cycles and would like to continue to do this, would be hesitant to switch if he were no longer able to exercise    Meds to be sent in separate encounter

## 2019-11-23 NOTE — Telephone Encounter (Signed)
Alejandro Evens, MD  Rcpg Rn Team 12 hours ago (6:32 PM)   KR  Please find out if he is on amlodipine. If so he could be switched to propranolol 10 mg twice daily in place of amlodipine as recommended by his neurologist for tremor. Thank you    Routing comment

## 2019-11-23 NOTE — Telephone Encounter (Signed)
Patient's appointment canceled/will call back and RS

## 2019-11-25 ENCOUNTER — Ambulatory Visit: Admitting: Cardiology

## 2019-12-21 ENCOUNTER — Ambulatory Visit: Admitting: Cardiology

## 2019-12-23 ENCOUNTER — Other Ambulatory Visit: Payer: Self-pay | Admitting: Cardiology

## 2019-12-23 DIAGNOSIS — E785 Hyperlipidemia, unspecified: Secondary | ICD-10-CM

## 2020-01-12 ENCOUNTER — Other Ambulatory Visit: Payer: Self-pay | Admitting: Cardiology

## 2020-01-12 DIAGNOSIS — I10 Essential (primary) hypertension: Secondary | ICD-10-CM

## 2020-01-27 ENCOUNTER — Other Ambulatory Visit: Payer: Self-pay | Admitting: Gastroenterology

## 2020-01-28 ENCOUNTER — Other Ambulatory Visit
Admission: RE | Admit: 2020-01-28 | Discharge: 2020-01-28 | Disposition: A | Discharge status: Home / Self Care | Source: Ambulatory Visit | Attending: Pediatrics | Admitting: Pediatrics

## 2020-01-28 DIAGNOSIS — E7849 Other hyperlipidemia: Secondary | ICD-10-CM | POA: Insufficient documentation

## 2020-01-28 DIAGNOSIS — Z0000 Encounter for general adult medical examination without abnormal findings: Secondary | ICD-10-CM | POA: Insufficient documentation

## 2020-01-28 DIAGNOSIS — R030 Elevated blood-pressure reading, without diagnosis of hypertension: Secondary | ICD-10-CM | POA: Insufficient documentation

## 2020-01-28 DIAGNOSIS — E298 Other testicular dysfunction: Secondary | ICD-10-CM | POA: Insufficient documentation

## 2020-01-28 LAB — COMPREHENSIVE METABOLIC PANEL
ALT: 37 U/L (ref 0–50)
AST: 45 U/L (ref 0–50)
Albumin: 4.6 g/dL (ref 3.5–5.2)
Alk Phos: 29 U/L — ABNORMAL LOW (ref 40–130)
Anion Gap: 13 (ref 7–16)
Bilirubin,Total: 1 mg/dL (ref 0.0–1.2)
CO2: 25 mmol/L (ref 20–28)
Calcium: 9.6 mg/dL (ref 8.6–10.2)
Chloride: 103 mmol/L (ref 96–108)
Creatinine: 1.19 mg/dL — ABNORMAL HIGH (ref 0.67–1.17)
GFR,Black: 75 *
GFR,Caucasian: 65 *
Glucose: 88 mg/dL (ref 60–99)
Lab: 24 mg/dL — ABNORMAL HIGH (ref 6–20)
Potassium: 4.4 mmol/L (ref 3.3–5.1)
Sodium: 141 mmol/L (ref 133–145)
Total Protein: 6.6 g/dL (ref 6.3–7.7)

## 2020-01-28 LAB — LIPID PANEL
Chol/HDL Ratio: 2.4
Cholesterol: 188 mg/dL
HDL: 77 mg/dL — ABNORMAL HIGH (ref 40–60)
LDL Calculated: 99 mg/dL
Non HDL Cholesterol: 111 mg/dL
Triglycerides: 61 mg/dL

## 2020-01-28 LAB — TESTOSTERONE, FREE, TOTAL
Testosterone,% Free: 2 %
Testosterone,Free: 32 pg/mL — ABNORMAL LOW (ref 47–244)
Testosterone: 195 ng/dL (ref 193–740)

## 2020-01-28 LAB — CBC AND DIFFERENTIAL
Baso # K/uL: 0.1 10*3/uL (ref 0.0–0.1)
Basophil %: 0.8 %
Eos # K/uL: 0.2 10*3/uL (ref 0.0–0.5)
Eosinophil %: 2.6 %
Hematocrit: 48 % (ref 40–51)
Hemoglobin: 15.7 g/dL (ref 13.7–17.5)
IMM Granulocytes #: 0 10*3/uL (ref 0.0–0.0)
IMM Granulocytes: 0.2 %
Lymph # K/uL: 2 10*3/uL (ref 1.3–3.6)
Lymphocyte %: 30.8 %
MCH: 32 pg (ref 26–32)
MCHC: 33 g/dL (ref 32–37)
MCV: 97 fL — ABNORMAL HIGH (ref 79–92)
Mono # K/uL: 0.7 10*3/uL (ref 0.3–0.8)
Monocyte %: 11 %
Neut # K/uL: 3.6 10*3/uL (ref 1.8–5.4)
Nucl RBC # K/uL: 0 10*3/uL (ref 0.0–0.0)
Nucl RBC %: 0 /100 WBC (ref 0.0–0.2)
Platelets: 220 10*3/uL (ref 150–330)
RBC: 5 MIL/uL (ref 4.6–6.1)
RDW: 12.3 % (ref 11.6–14.4)
Seg Neut %: 54.6 %
WBC: 6.6 10*3/uL (ref 4.2–9.1)

## 2020-01-28 LAB — SEX HORMONE BINDING GLOBULIN: Sex Hormone Binding Glob: 39 nmol/L (ref 10–80)

## 2020-01-28 LAB — TSH: TSH: 2.69 u[IU]/mL (ref 0.27–4.20)

## 2020-02-09 ENCOUNTER — Other Ambulatory Visit: Payer: Self-pay | Admitting: Cardiology

## 2020-02-09 DIAGNOSIS — E785 Hyperlipidemia, unspecified: Secondary | ICD-10-CM

## 2020-03-15 ENCOUNTER — Telehealth: Payer: Self-pay | Admitting: Cardiology

## 2020-03-15 NOTE — Telephone Encounter (Signed)
Pt would like advice if he should take amlodipine again as he is not taking another medication he would like advice on his medications

## 2020-03-15 NOTE — Telephone Encounter (Signed)
Left detailed message on designated VM and to call back if any questions.

## 2020-03-15 NOTE — Telephone Encounter (Signed)
If blood pressure is running in the range of 120/80 okay to not take amlodipine and continue to monitor.  If blood pressure starts rising above 130 systolic and above 85 diastolic should resume amlodipine therapy.  Thank you

## 2020-03-15 NOTE — Telephone Encounter (Signed)
Spoke with pt. States that he was wondering if he should start amlodipine again. He stopped it because his neuro surgeon switched him to propranolol but he never actually started taking it. His BP has been running 120s.80s. wondering if that's okay. Please advise. Thank you.

## 2020-03-23 ENCOUNTER — Other Ambulatory Visit: Payer: Self-pay

## 2020-03-23 ENCOUNTER — Encounter: Payer: Self-pay | Admitting: Cardiology

## 2020-03-23 MED ORDER — AMLODIPINE BESYLATE 5 MG PO TABS *I*
5.0000 mg | ORAL_TABLET | Freq: Every day | ORAL | 2 refills | Status: DC
Start: 2020-03-23 — End: 2020-03-31

## 2020-03-23 NOTE — Telephone Encounter (Signed)
Dr. Smith Robert please see the mychart message. He would like to stop propanolol and just take amlodipine 5mg . He needs a new script. Okay to send?

## 2020-03-23 NOTE — Telephone Encounter (Signed)
Okay to prescribe amlodipine 5 mg daily and taper propranolol 12 over 1 week.  Propranolol alternate days for 1 week and stop it. Continue to monitor blood pressure and heart rate.

## 2020-03-31 ENCOUNTER — Other Ambulatory Visit: Payer: Self-pay

## 2020-03-31 DIAGNOSIS — I10 Essential (primary) hypertension: Secondary | ICD-10-CM

## 2020-03-31 MED ORDER — AMLODIPINE BESYLATE 5 MG PO TABS *I*
5.0000 mg | ORAL_TABLET | Freq: Every day | ORAL | 2 refills | Status: DC
Start: 2020-03-31 — End: 2021-02-02

## 2020-06-06 ENCOUNTER — Telehealth: Payer: Self-pay | Admitting: Cardiology

## 2020-06-06 ENCOUNTER — Ambulatory Visit: Admitting: Cardiology

## 2020-06-06 ENCOUNTER — Encounter: Payer: Self-pay | Admitting: Cardiology

## 2020-06-06 VITALS — BP 142/82 | HR 69 | Ht 68.0 in | Wt 170.0 lb

## 2020-06-06 DIAGNOSIS — I10 Essential (primary) hypertension: Secondary | ICD-10-CM

## 2020-06-06 DIAGNOSIS — I6523 Occlusion and stenosis of bilateral carotid arteries: Secondary | ICD-10-CM

## 2020-06-06 DIAGNOSIS — I6521 Occlusion and stenosis of right carotid artery: Secondary | ICD-10-CM

## 2020-06-06 DIAGNOSIS — E785 Hyperlipidemia, unspecified: Secondary | ICD-10-CM

## 2020-06-06 NOTE — Telephone Encounter (Signed)
At check out patient stated he will call and schedule for October when it gets closer .

## 2020-06-06 NOTE — Progress Notes (Signed)
[facility]        Cardiology Office Revisit Note    Date of Visit: 06/06/2020 Patient: Alejandro Dennis   Patients PCP: Jetty Peeks, MD Patient DOB: [birth year]  EMRN: [id number]     Subjective/Reason For Visit     I had the pleasure of seeing Alejandro Dennis in cardiology followup on 06/06/2020.   Alejandro Dennis returned today for a follow-up evaluation.  He developed COVID-pneumonia last fall and will not syncope for a few days.  Otherwise he is doing reasonably well and in fact is pretty much back to his routine including running exercises.  Is planning on moving to [place] for good this fall.    He had a normal stress echocardiogram in 2020 obtained when he had abnormal calcium scoring of the coronary arteries.  He also has carotid vessel disease and had an endarterectomy on right side in 2017.  Follow-up carotid Doppler study showed mild ICA stenosis on left side and noncritical ICA stenosis on the right side.  He has hypertension and hyperlipidemia.    Alejandro Dennis is a 65 year old male who has been demonstrated to have evidence of premature atherosclerotic vascular disease given history of carotid endarterectomy on the right side in 2017 for severe right ICA stenosis and increased calcium coronary arteries scores. He had x-ray of the right hip joint and was incidentally discovered to have right femoral artery severe calcification. He self-referred tome for further cardiac workup.  Doppler study of the lower extremity showed no evidence of flow limitation.  Alejandro Dennis otherwise is quite active and in factruns4-5 miles quite frequently with no ongoing symptoms of chest pain and dyspnea or palpitations or lightheadedness.        Past Medical History:   Diagnosis Date    Carotid artery stenosis, asymptomatic, right     Cervical disc disorder at C5-C6 level with radiculopathy 02/22/2019    Cervical radiculopathy - s/p corrective surgery, resolved 06/25/2010    HLD (hyperlipidemia)      Past Surgical History:    Procedure Laterality Date    Carotid endarterectomy Right 07/19/2015    INGUINAL HERNIA REPAIR Right 2010    NECK SURGERY  01/2019    for C5-C6 nerve root impingement     ROS     Constitutional symptoms negative  Weight loss a few pounds since COVID-pneumonia.  GI unremarkable  Respiratory unremarkable  Cardiac as described above  Neurological unremarkable      Medications     Current Outpatient Medications   Medication Sig    amLODIPine (NORVASC) 5 mg tablet Take 1 tablet (5 mg total) by mouth daily    rosuvastatin (CRESTOR) 10 mg tablet TAKE 1 TABLET BY MOUTH EVERY DAY    pantoprazole (PROTONIX) 40 MG EC tablet Take 1 tablet (40 mg total) by mouth every morning for 10 days  Swallow whole. Do not crush, break, or chew.    acetaminophen (TYLENOL) 500 mg tablet Take 1,000 mg by mouth every 6 hours as needed    sildenafil (REVATIO) 20 MG tablet Take 40-60 mg by mouth daily as needed      testosterone cypionate (DEPO-TESTOSTERONE) 200 MG/ML injection Inject 400 mg into the muscle  Every 2.5 weeks    aspirin 81 MG EC tablet Take 81 mg by mouth every morning       ibuprofen (ADVIL,MOTRIN) 200 MG tablet Take 600 mg by mouth every 6 hours as needed       Vitals and Physical Exam  Alejandro Dennis's  height is 1.727 m (5\' 8" ) and weight is 77.1 kg (170 lb). His blood pressure is 142/82 and his pulse is 69. His oxygen saturation is 98%.  Body mass index is 25.85 kg/m.    Physical Exam     Healthy appearing pleasant patient with repeat blood pressure 130/80.  Constitutional: Patient is oriented to person, place, and time. Appears well-developed and well-nourished.   HENT:   Head: Normocephalic.   Eyes: Conjunctivae are normal. No scleral icterus.   Neck: Neck supple. No JVD present. No thyromegaly present. There are no carotid bruits.  Cardiovascular: Normal rate, regular rhythm, normal heart sounds and intact distal pulses.  Exam reveals no gallop.    No murmur heard.  Pulmonary/Chest: Effort normal and breath sounds  normal with no wheezing or rales.    Abdominal: Soft with no mass.   Musculoskeletal: Normal range of motion with no edema.   Neurological: alert and oriented to person, place, and time.   Skin: Skin is warm.   Psychiatric: With normal mood and affect.   Vitals reviewed.      Laboratory Data     Hematology:   Results in Past 730 Days  Result Component Current Result Previous Result   WBC 6.6 (01/28/2020) 7.8 (10/18/2019)   Hemoglobin 15.7 (01/28/2020) 15.2 (10/18/2019)   Hematocrit 48 (01/28/2020) 48 (10/18/2019)   Platelets 220 (01/28/2020) 288 (10/18/2019)     Chemistry:   Results in Past 730 Days  Result Component Current Result Previous Result   Sodium 141 (01/28/2020) 140 (10/17/2019)   Potassium 4.4 (01/28/2020) 4.8 (H) (10/17/2019)   Creatinine 1.19 (H) (01/28/2020) 0.86 (10/17/2019)   Glucose 88 (01/28/2020) 131 (H) (10/17/2019)   Calcium 9.6 (01/28/2020) 8.3 (L) (10/17/2019)   Magnesium 2.2 (10/16/2019) Not in Time Range   AST 45 (01/28/2020) 85 (H) (10/17/2019)   ALT 37 (01/28/2020) 108 (H) (10/17/2019)   TSH 2.69 (01/28/2020) 1.95 (10/05/2019)     Coagulation Studies:   Results in Past 730 Days  Result Component Current Result Previous Result   aPTT 30.7 (10/16/2019) Not in Time Range   INR 1.0 (10/16/2019) Not in Time Range     Cardiac:   Results in Past 730 Days  Result Component Current Result Previous Result   TROP T 0 HR High Sensitivity 8 (10/16/2019) Not in Time Range   CK 71 (10/16/2019) Not in Time Range   NT-pro BNP 302 (10/18/2019) 371 (10/16/2019)     Lipids:   Results in Past 730 Days  Result Component Current Result Previous Result   Cholesterol 188 (01/28/2020) 203 (!) (08/09/2019)   HDL 77 (H) (01/28/2020) 71 (H) (08/09/2019)   Triglycerides 61 (01/28/2020) 148 (10/16/2019)   LDL Calculated 99 (01/28/2020) 111 (08/09/2019)   Chol/HDL Ratio 2.4 (01/28/2020) 2.9 (08/09/2019)     Cardiac/Imaging Data & Risk Scores     ECG:       EXERCISE STRESS ECHO LIMITED (ISCHEMIA ONLY) 09/02/2018    Interpretation Summary   No evidence of myocardial ischemia by  EKG or echo criteria.   The blood pressure response to exercise was hypertensive.   Normal resting LV ejection fraction and wall motion.   The patient's functional capacity is normal.                 CV 11/02/2018 CAROTID BILATERAL 11/10/2019    Narrative  History:   LICA stenosis, RCEA  Prior studies: 10/20    Right Internal Carotid Artery (ICA)  No significant stenosis (0-15%), stable  from prior. Post CEA  Right vertebral artery is antegrade      Left Internal Carotid Artery (ICA)  Mild stenosis (16-49%), stable from prior.  Left vertebral artery is antegrade      * Description of procedure: sonographic evaluation of the bilateral extracranial carotid arteries was performed using B-mode, color and spectral doppler imaging.           Impression and Plan     Patient Active Problem List   Diagnosis Code    R ICA stenosis s/p R CEA 6/28 I65.21    Bilateral carotid artery disease I77.9    Pneumonia due to COVID-19 virus U07.1, J12.82    COVID-19 U07.1    Abnormal EKG R94.31    HLD (hyperlipidemia) E78.5       This is an 65 y.o. 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  He has hypertension and hyperlipidemia as risk factors.  He is status post carotid endarterectomy.  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  He suffered COVID pneumonia last fall with remarkable improvement.  His stamina is almost 80 to 90% compared to his baseline according to his own admission.    Coronary atherosclerosis  Reviewed the implications of calcium scoring of the coronary arteries being mildly abnormal.  It signifies coronary atherosclerosis but the lesions are not site-specific and do not have any correlation with degree of narrowing.    He had a normal stress echo and this is reassuring.    Would continue aggressive coronary risk factor modification.    Hyperlipidemia,  He is tolerating rosuvastatin therapy 10 mg daily and this will be continued.  We need to ensure that LDL has  improved below 70 range.  I have ordered repeat fasting lipid profile in a couple of months.  Hopefully LDL has improved to below 70 with initial exercise program and diet..    Hypertension  We need to ensure systolic blood pressure stays below 130 and diastolic below 85.  Blood pressure control is improved since he is on amlodipine therapy and this will be continued.    He is on low-dose aspirin therapy and this will be continued because of documented carotid vessel disease severe requiring endarterectomy.    He was counseled on low fat and low calorie diet.    I have provided written information on heart healthy low-fat and low-sodium diet.  Preferably told him to follow Dash diet and attempt to maintain good blood pressure control.    I have told him that he will need to find a cardiologist and vascular surgeon for a follow-up on his issues once he is moved to [place].    Thank you for having allowed me to participate in the care of this pleasant patient.      More than 20-30 minutes was spent in face to face time and non face to face time (reviewing chart, labs, imaging, documentation, coordinating the above plan) by myself on the date of service.     I personally spent  30 minutes on the calendar day of the encounter, including pre and post visit work.              Jess Barters Alejandro Boehne, MD  Electronically signed on 06/06/2020 at 10:55 AM.

## 2020-06-06 NOTE — Patient Instructions (Signed)
Patient Education   Carotid Doppler in the fall  Heart Healthy Diet   WHAT YOU NEED TO KNOW:   A heart healthy diet is an eating plan low in unhealthy fats and sodium (salt). The plan is high in healthy fats and fiber. A heart healthy diet helps improve your cholesterol levels and lowers your risk for heart disease and stroke. A dietitian will teach you how to read and understand food labels.  DISCHARGE INSTRUCTIONS:   Heart healthy diet guidelines to follow:    Choose foods that contain healthy fats.      ? Unsaturated fats  include monounsaturated and polyunsaturated fats. Unsaturated fat is found in foods such as soybean, canola, olive, corn, and safflower oils. It is also found in soft tub margarine that is made with liquid vegetable oil.    ? Omega-3 fat  is found in certain fish, such as salmon, tuna, and trout, and in walnuts and flaxseed. Eat fish high in omega-3 fats at least 2 times a week.        Get 20 to 30 grams of fiber each day.  Fruits, vegetables, whole-grain foods, and legumes (cooked beans) are good sources of fiber.          Limit or do not have unhealthy fats.      ? Cholesterol  is found in animal foods, such as eggs and lobster, and in dairy products made from whole milk. Limit cholesterol to less than 200 mg each day.    ? Saturated fat  is found in meats, such as bacon and hamburger. It is also found in chicken or [place] skin, whole milk, and butter. Limit saturated fat to less than 7% of your total daily calories.    ? Trans fat  is found in packaged foods, such as potato chips and cookies. It is also in hard margarine, some fried foods, and shortening. Do not eat foods that contain trans fats.     Limit sodium as directed.  You may be told to limit sodium to 2,000 to 2,300 mg each day. Choose low-sodium or no-salt-added foods. Add little or no salt to food you prepare. Use herbs and spices in place of salt.       Include the following in your heart healthy plan:  Ask your dietitian or  healthcare provider how many servings to have from each of the following food groups:   Grains:      ? Whole-wheat breads, cereals, and pastas, and brown rice    ? Low-fat, low-sodium crackers and chips     Vegetables:      ? Broccoli, green beans, green peas, and spinach    ? Collards, kale, and lima beans    ? Carrots, sweet potatoes, tomatoes, and peppers    ? Canned vegetables with no salt added     Fruits:      ? Bananas, peaches, pears, and pineapple    ? Grapes, raisins, and dates    ? Oranges, tangerines, grapefruit, orange juice, and grapefruit juice    ? Apricots, mangoes, melons, and papaya    ? Raspberries and strawberries    ? Canned fruit with no added sugar     Low-fat dairy:      ? Nonfat (skim) milk, 1% milk, and low-fat almond, cashew, or soy milks fortified with calcium    ? Low-fat cheese, regular or frozen yogurt, and cottage cheese     Meats and proteins:      ?  Lean cuts of beef and pork (loin, leg, round), skinless chicken and [place]    ? Legumes, soy products, egg whites, or nuts    Limit or do not include the following in your heart healthy plan:    Unhealthy fats and oils:      ? Whole or 2% milk, cream cheese, sour cream, or cheese    ? High-fat cuts of beef (T-bone steaks, ribs), chicken or [place] with skin, and organ meats such as liver    ? Butter, stick margarine, shortening, and cooking oils such as coconut or palm oil     Foods and liquids high in sodium:      ? Packaged foods, such as frozen dinners, cookies, macaroni and cheese, and cereals with more than 300 mg of sodium per serving    ? Vegetables with added sodium, such as instant potatoes, vegetables with added sauces, or regular canned vegetables    ? Cured or smoked meats, such as hot dogs, bacon, and sausage    ? High-sodium ketchup, barbecue sauce, salad dressing, pickles, olives, soy sauce, or miso     Foods and liquids high in sugar:      ? Candy, cake, cookies, pies, or doughnuts    ? Soft drinks (soda), sports  drinks, or sweetened tea    ? Canned or dry mixes for cakes, soups, sauces, or gravies    Other healthy heart guidelines:    Do not smoke.  Nicotine and other chemicals in cigarettes and cigars can cause lung and heart damage. Ask your healthcare provider for information if you currently smoke and need help to quit. E-cigarettes or smokeless tobacco still contain nicotine. Talk to your healthcare provider before you use these products.      Limit or do not drink alcohol as directed.  Alcohol can damage your heart and raise your blood pressure. Your healthcare provider may give you specific daily and weekly limits. The general recommended limit is 1 drink a day for women 21 or older and for men 56 or older. Do not have more than 3 drinks in a day or 7 in a week. The recommended limit is 2 drinks a day for men 15 to 64 years of age. Do not have more than 4 drinks in a day or 14 in a week. A drink of alcohol is 12 ounces of beer, 5 ounces of wine, or 1 ounces of liquor.     Exercise regularly.  Exercise can help you maintain a healthy weight and improve your blood pressure and cholesterol levels. Regular exercise can also decrease your risk for heart problems. Ask your healthcare provider about the best exercise plan for you. Do not start an exercise program without asking your healthcare provider.       Follow up with your doctor or cardiologist as directed:  Write down your questions so you remember to ask them during your visits.   Copyright [employer] Information is for [employer] use only and may not be sold, redistributed or otherwise used for commercial purposes. All illustrations and images included in CareNotes are the copyrighted property of [employer]M., Inc[employer] or [place]  The above information is an educational aid only. It is not intended as medical advice for individual conditions or treatments. Talk to your doctor, nurse or pharmacist before following any medical regimen to see if it  is safe and effective for you.

## 2020-06-13 ENCOUNTER — Encounter: Payer: Self-pay | Admitting: Emergency Medicine

## 2020-06-13 ENCOUNTER — Ambulatory Visit
Admission: AD | Admit: 2020-06-13 | Discharge: 2020-06-13 | Disposition: A | Discharge status: Home / Self Care | Source: Ambulatory Visit | Attending: Emergency Medicine | Admitting: Emergency Medicine

## 2020-06-13 DIAGNOSIS — J189 Pneumonia, unspecified organism: Secondary | ICD-10-CM | POA: Insufficient documentation

## 2020-06-13 DIAGNOSIS — Z20828 Contact with and (suspected) exposure to other viral communicable diseases: Secondary | ICD-10-CM | POA: Insufficient documentation

## 2020-06-13 DIAGNOSIS — Z20822 Contact with and (suspected) exposure to covid-19: Secondary | ICD-10-CM | POA: Insufficient documentation

## 2020-06-13 MED ORDER — AMOXICILLIN 500 MG PO CAPS *I*
1000.0000 mg | ORAL_CAPSULE | Freq: Three times a day (TID) | ORAL | 0 refills | Status: AC
Start: 2020-06-13 — End: 2020-06-18

## 2020-06-13 MED ORDER — ALBUTEROL SULFATE HFA 108 (90 BASE) MCG/ACT IN AERS *I*
2.0000 | INHALATION_SPRAY | Freq: Four times a day (QID) | RESPIRATORY_TRACT | 0 refills | Status: AC | PRN
Start: 2020-06-13 — End: 2020-07-13

## 2020-06-13 MED ORDER — AZITHROMYCIN 250 MG PO TABS *I*
ORAL_TABLET | ORAL | 0 refills | Status: AC
Start: 2020-06-13 — End: 2020-06-18

## 2020-06-13 NOTE — Discharge Instructions (Signed)
Amoxicillin and azithromycin and albuterol as prescribed  Take a daily probiotic  Go to the ED if worsening

## 2020-06-13 NOTE — ED Triage Notes (Addendum)
Developed dry cough x5 days ago,occasioanlly coughs up green / yellow mucous. Today developed low grade fevers. Feels occasionally short of breath. Used Mucinex / Delsym.

## 2020-06-13 NOTE — [facility] Provider Note (Signed)
History     Chief Complaint   Patient presents with    Cough     64yom with 5 days of congestion and cough. However today woke up with fever and wet mucousy cough. Gets tired more easily today and SOB if lots of exertion. No chest pain. Feels wheezing on and off in chest. In hospital for 4 days last fall for covid and bacterial pna. Afraid of going that way again.       History provided by:  Patient  Language interpreter used: No        Medical/Surgical/Family History     Past Medical History:   Diagnosis Date    Carotid artery stenosis, asymptomatic, right     Cervical disc disorder at C5-C6 level with radiculopathy 02/22/2019    Cervical radiculopathy - s/p corrective surgery, resolved 06/25/2010    HLD (hyperlipidemia)         Patient Active Problem List   Diagnosis Code    R ICA stenosis s/p R CEA 6/28 I65.21    Bilateral carotid artery disease I77.9    Pneumonia due to COVID-19 virus U07.1, J12.82    COVID-19 U07.1    Abnormal EKG R94.31    HLD (hyperlipidemia) E78.5            Past Surgical History:   Procedure Laterality Date    Carotid endarterectomy Right 07/19/2015    INGUINAL HERNIA REPAIR Right 2010    NECK SURGERY  01/2019    for C5-C6 nerve root impingement     Family History   Problem Relation Age of Onset    Other Father         Carotid artery stenosis s/p BCEA    Lung cancer Father     Stroke Neg Hx     Heart attack Neg Hx     Heart surgery Neg Hx     Diabetes Neg Hx           Social History     Tobacco Use    Smoking status: Never Smoker    Smokeless tobacco: Never Used   Substance Use Topics    Alcohol use: Yes     Alcohol/week: 5.0 standard drinks     Types: 5 Standard drinks or equivalent per week    Drug use: No     Living Situation     Questions Responses    Patient lives with Spouse    Homeless No    Caregiver for other family member No    External Services None    Employment Employed    Domestic Violence Risk No                Review of Systems   Review of Systems    Constitutional: Positive for appetite change, fatigue and fever. Negative for chills and diaphoresis.   HENT: Negative for congestion, ear pain, postnasal drip, rhinorrhea, sinus pressure, sinus pain, sore throat, trouble swallowing and voice change.    Eyes: Negative for discharge and redness.   Respiratory: Positive for cough, shortness of breath and wheezing. Negative for chest tightness.    Cardiovascular: Negative for chest pain.   Gastrointestinal: Negative for diarrhea, nausea and vomiting.   Genitourinary: Negative for decreased urine volume and difficulty urinating.   Musculoskeletal: Negative for arthralgias and neck pain.   Skin: Negative for rash.   Neurological: Negative for dizziness, weakness and headaches.       Physical Exam   Triage Vitals  First Recorded BP: 146/83, Resp: 18, Temp: 37.7 C (99.8 F), Temp src: Oral Oxygen Therapy SpO2: 96 %, Oximetry Source: Lt Hand, O2 Device: None (Room air), Heart Rate: 107, (06/13/20 1421)  .  First Pain Reported  0-10 Scale: 0, (06/13/20 1425)       Physical Exam  Vitals and nursing note reviewed.   Constitutional:       General: He is not in acute distress.     Appearance: Normal appearance. He is well-developed. He is not ill-appearing, toxic-appearing or diaphoretic.   HENT:      Head: Normocephalic and atraumatic. No right periorbital erythema or left periorbital erythema.      Right Ear: Hearing, ear canal and external ear normal. A middle ear effusion is present. Tympanic membrane is not erythematous.      Left Ear: Hearing, ear canal and external ear normal. A middle ear effusion is present. Tympanic membrane is not erythematous.      Nose: Nose normal. No mucosal edema, congestion or rhinorrhea.      Right Sinus: No maxillary sinus tenderness or frontal sinus tenderness.      Left Sinus: No maxillary sinus tenderness or frontal sinus tenderness.      Mouth/Throat:      Mouth: Mucous membranes are moist.      Pharynx: Uvula midline. No posterior  oropharyngeal erythema.      Tonsils: No tonsillar exudate. 0 on the right. 0 on the left.   Eyes:      Conjunctiva/sclera: Conjunctivae normal.      Pupils: Pupils are equal, round, and reactive to light.   Cardiovascular:      Rate and Rhythm: Regular rhythm. Tachycardia present.      Pulses: Normal pulses.      Heart sounds: Normal heart sounds.   Pulmonary:      Effort: Pulmonary effort is normal.      Breath sounds: Examination of the right-lower field reveals decreased breath sounds. Examination of the left-lower field reveals decreased breath sounds. Decreased breath sounds present. No wheezing.   Musculoskeletal:      Cervical back: Normal range of motion.   Lymphadenopathy:      Cervical: Cervical adenopathy present.      Right cervical: Superficial cervical adenopathy present. No deep or posterior cervical adenopathy.     Left cervical: Superficial cervical adenopathy present. No deep or posterior cervical adenopathy.   Skin:     General: Skin is warm and dry.      Capillary Refill: Capillary refill takes less than 2 seconds.      Coloration: Skin is not pale.      Findings: No rash.   Neurological:      Mental Status: He is alert, oriented to person, place, and time and easily aroused.          Medical Decision Making        Medical Decision Making  Assessment:    64yom had a uri for past 5 days but now a fever and feeling more ill. Harsh cough. Slightly decrease sounds in bases. Concerning for pna    Differential diagnosis:    Acute viral bronchitis  Community acquired pneumonia  Acute URI NOS  Acute pneumonitis  Pleurisy      Plan and Results:    No radiology available at this location. Will treat for CAP based on symptoms worsening new fever and decreased lung sounds. No wheezing.   Amoxicillin and azithromycin and albuterol as prescribed  Take a daily probiotic  Go to the ED if worsening        Diagnosis and Disposition:   I have reviewed all labs and discharge instructions with the patient. I have  answered all questions to the best of my knowledge. Patient/caregiver verbalizes understanding and is agreeable to discharge.    [employer] was used for part/all of this encounter. Errors in grammar were changed and fixed to the best of my ability.     Orders Placed This Encounter     Influenza A PCR     Influenza B PCR     RSV PCR     COVID-19 PCR     amoxicillin (AMOXIL) 500 mg capsule     azithromycin (ZITHROMAX) 250 mg tablet     albuterol HFA (PROVENTIL, VENTOLIN, PROAIR HFA) 108 (90 Base) MCG/ACT inhaler      No results found for this or any previous visit (from the past 24 hour(s)).      Final Diagnosis  (J18.9) Community acquired pneumonia, unspecified laterality  (primary encounter diagnosis)    Encourage fluids, encourage rest, good hand hygiene.    Use over the counter medications as discussed.    Please start the new medications as below:    Current Discharge Medication List    New Medications    amoxicillin (AMOXIL) 1,000 mg  Dose: 1,000 mg  Take 1,000 mg by mouth every 8 hours   Quantity 30 capsule Refill 0  Start date: 06/13/2020, End date: 06/18/2020    azithromycin (ZITHROMAX) 250 mg tablet  for Community Acquired Pneumonia Take 2 tablets (500 mg) on day 1, followed by 1 tablet (250 mg) on days 2-5.  Quantity 6 tablet Refill 0  Start date: 06/13/2020, End date: 06/18/2020  Comments: Emergency Encounter    albuterol HFA (PROVENTIL, VENTOLIN, PROAIR HFA) 2 puffs  Dose: 2 puffs  Inhale 2 puffs into the lungs every 6 hours as needed  Shake well before each use.  Quantity 1 each Refill 0  Start date: 06/13/2020, End date: 07/13/2020  Comments: Emergency Encounter          Please follow up with your physician as below:        Thank you Abimael Zeiter for coming to [facility] for your health care concerns.    If your condition changes and/or worsens please follow up with her primary doctor and/or return to the urgent care center.    If short of breath, chest pains or any other concerns  please report to the emergency room.    In the event of an Emergency dial 911.        Final Diagnosis  Final diagnoses:   [J18.9] Community acquired pneumonia, unspecified laterality (Primary)         Maudry Mayhew, PA              [address] [place], [place]  06/13/20 1434

## 2020-06-14 LAB — COVID-19 NAAT (PCR): COVID-19 NAAT (PCR): NEGATIVE

## 2020-06-14 LAB — INFLUENZA B PCR: Influenza B PCR: 0

## 2020-06-14 LAB — COVID-19 PCR

## 2020-06-14 LAB — RSV PCR: RSV PCR: 0

## 2020-06-14 LAB — INFLUENZA A: Influenza A PCR: 0

## 2020-10-19 ENCOUNTER — Other Ambulatory Visit: Payer: Self-pay | Admitting: Gastroenterology

## 2020-10-23 ENCOUNTER — Other Ambulatory Visit
Admission: RE | Admit: 2020-10-23 | Discharge: 2020-10-23 | Disposition: A | Discharge status: Home / Self Care | Source: Ambulatory Visit | Attending: Pediatrics | Admitting: Pediatrics

## 2020-10-23 DIAGNOSIS — E7849 Other hyperlipidemia: Secondary | ICD-10-CM | POA: Insufficient documentation

## 2020-10-23 LAB — COMPREHENSIVE METABOLIC PANEL
ALT: 37 U/L (ref 0–50)
AST: 39 U/L (ref 0–50)
Albumin: 4.4 g/dL (ref 3.5–5.2)
Alk Phos: 28 U/L — ABNORMAL LOW (ref 40–130)
Anion Gap: 15 (ref 7–16)
Bilirubin,Total: 1.6 mg/dL — ABNORMAL HIGH (ref 0.0–1.2)
CO2: 26 mmol/L (ref 20–28)
Calcium: 9.2 mg/dL (ref 8.6–10.2)
Chloride: 99 mmol/L (ref 96–108)
Creatinine: 1.15 mg/dL (ref 0.67–1.17)
Glucose: 106 mg/dL — ABNORMAL HIGH (ref 60–99)
Lab: 18 mg/dL (ref 6–20)
Potassium: 4.3 mmol/L (ref 3.3–5.1)
Sodium: 140 mmol/L (ref 133–145)
Total Protein: 6.5 g/dL (ref 6.3–7.7)
eGFR BY CREAT: 71 *

## 2020-10-23 LAB — LIPID PANEL
Chol/HDL Ratio: 2.5
Cholesterol: 201 mg/dL — AB
HDL: 82 mg/dL — ABNORMAL HIGH (ref 40–60)
LDL Calculated: 100 mg/dL
Non HDL Cholesterol: 119 mg/dL
Triglycerides: 93 mg/dL

## 2020-10-23 LAB — SEX HORMONE BINDING GLOBULIN: Sex Hormone Binding Glob: 37 nmol/L (ref 10–80)

## 2020-10-23 LAB — TESTOSTERONE (ADULT MALES OR INDIVIDUALS ON TESTOSTERONE HORMONE THERAPY): Testosterone: >1500 ng/dL — ABNORMAL HIGH (ref 193–740)

## 2020-10-23 LAB — VITAMIN B12: Vitamin B12: 219 pg/mL — ABNORMAL LOW (ref 232–1245)

## 2020-10-25 ENCOUNTER — Other Ambulatory Visit
Admission: RE | Admit: 2020-10-25 | Discharge: 2020-10-25 | Disposition: A | Discharge status: Home / Self Care | Source: Ambulatory Visit | Attending: Pediatrics | Admitting: Pediatrics

## 2020-10-25 ENCOUNTER — Other Ambulatory Visit: Payer: Self-pay | Admitting: Gastroenterology

## 2020-10-25 DIAGNOSIS — E298 Other testicular dysfunction: Secondary | ICD-10-CM | POA: Insufficient documentation

## 2020-10-25 LAB — BILIRUBIN,FRACTIONATED
Bili,Indirect: 0.9 mg/dL (ref 0.1–1.0)
Bilirubin,Direct: 0.3 mg/dL (ref 0.0–0.3)
Bilirubin,Total: 1.2 mg/dL (ref 0.0–1.2)

## 2020-10-25 LAB — TESTOSTERONE (ADULT MALES OR INDIVIDUALS ON TESTOSTERONE HORMONE THERAPY)
Testosterone,% Free: 2 %
Testosterone,Free: 209 pg/mL (ref 47–244)
Testosterone: 986 ng/dL — ABNORMAL HIGH (ref 193–740)

## 2020-10-25 LAB — SEX HORMONE BINDING GLOBULIN: Sex Hormone Binding Glob: 39 nmol/L (ref 10–80)

## 2021-02-02 ENCOUNTER — Other Ambulatory Visit: Payer: Self-pay | Admitting: Cardiology

## 2021-02-02 ENCOUNTER — Encounter: Payer: Self-pay | Admitting: Cardiology

## 2021-02-02 DIAGNOSIS — I10 Essential (primary) hypertension: Secondary | ICD-10-CM

## 2021-02-02 NOTE — Telephone Encounter (Signed)
This patient attachment is clinically relevant.  Please keep in the patient's chart.    [x]  Document  []  Photo    Brief attachment description: ultrasound  (Ex. L forearm rash, WC papers)    Thank you,  Tressia Miners, RN

## 2021-04-18 IMAGING — DX LUMBAR SPINE COMPLETE 4 VIEWS
1 series · 4 of 4 positions shown · non-contrast
Comparison: None

________________________________________________________________________________________________ 
LUMBAR SPINE COMPLETE 4 VIEWS, 04/18/2021 [DATE]: 
CLINICAL INDICATION:  Radiculopathy, left-sided low back pain extending to leg 
and foot

[Series 1: AP · U · 0.14mm/px · 4 of 4 slices shown]
[im 1/4]
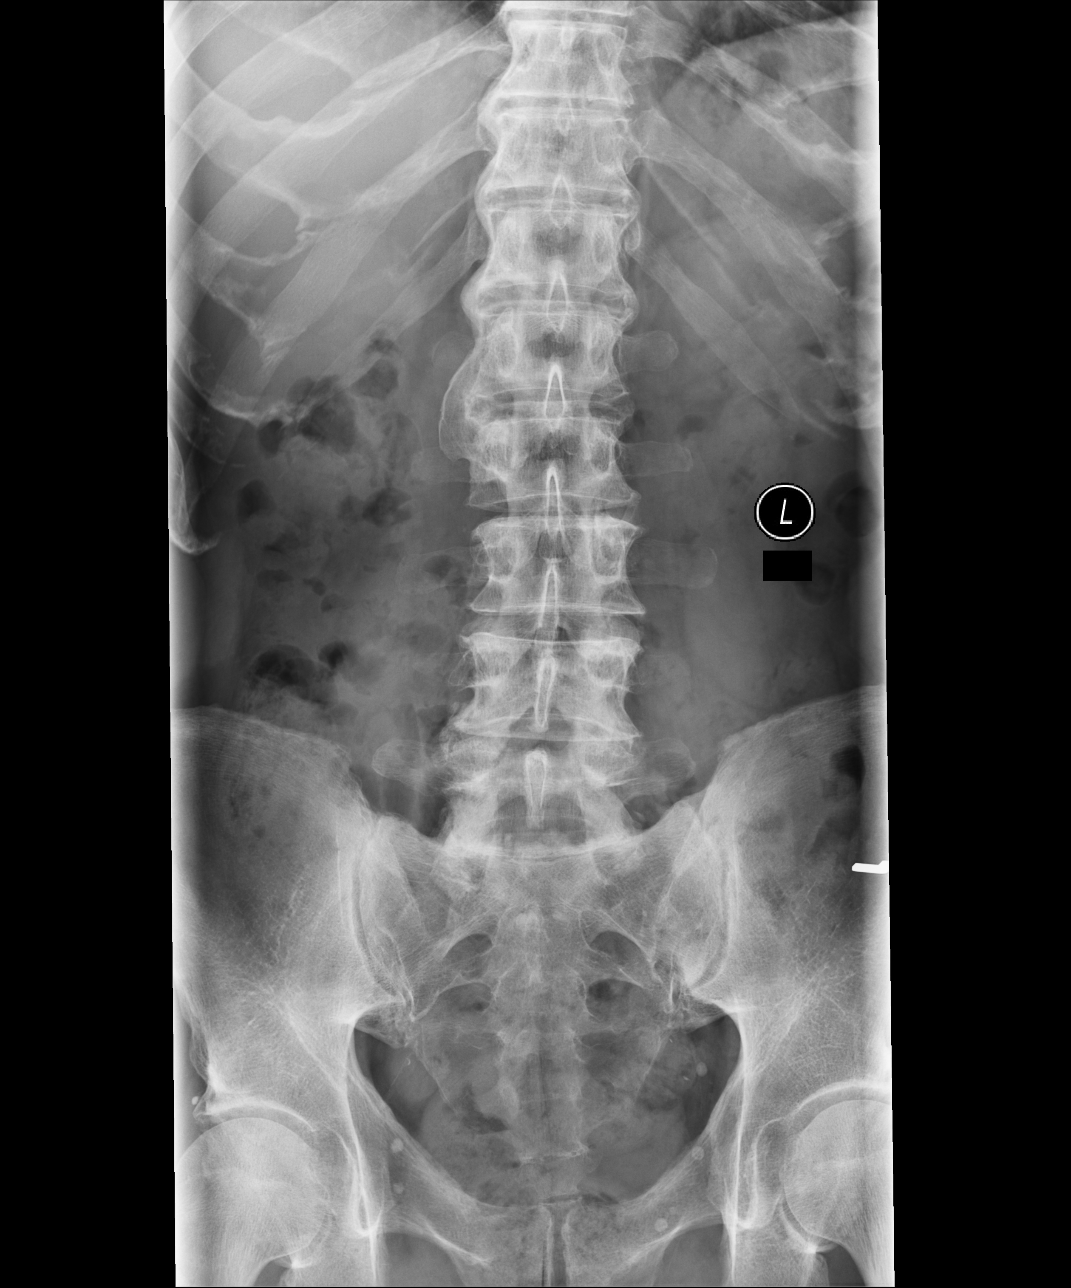
[im 2/4]
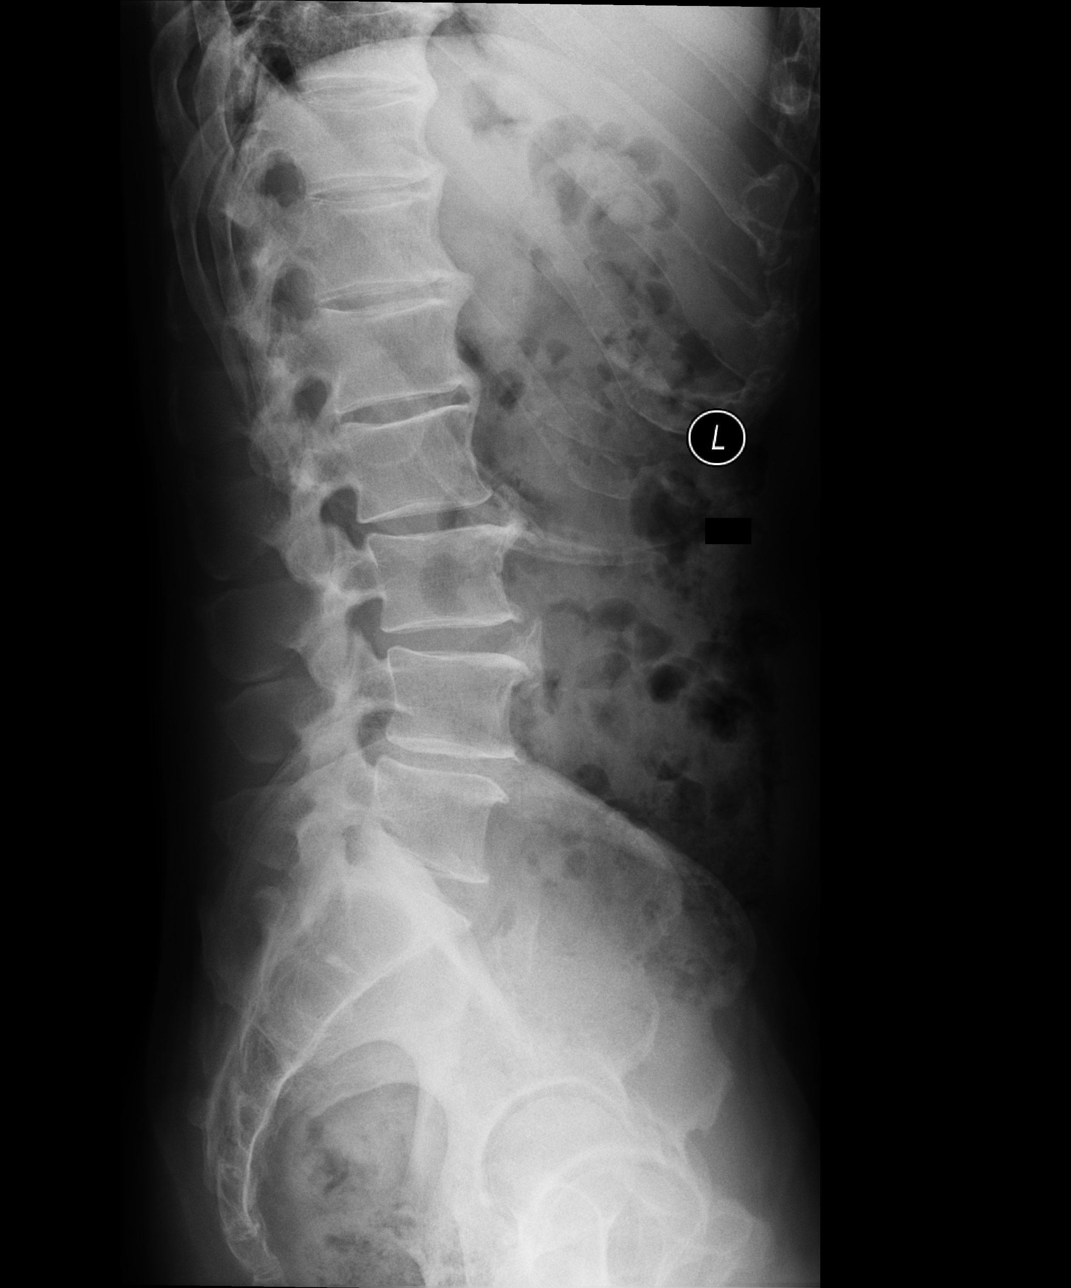
[im 3/4]
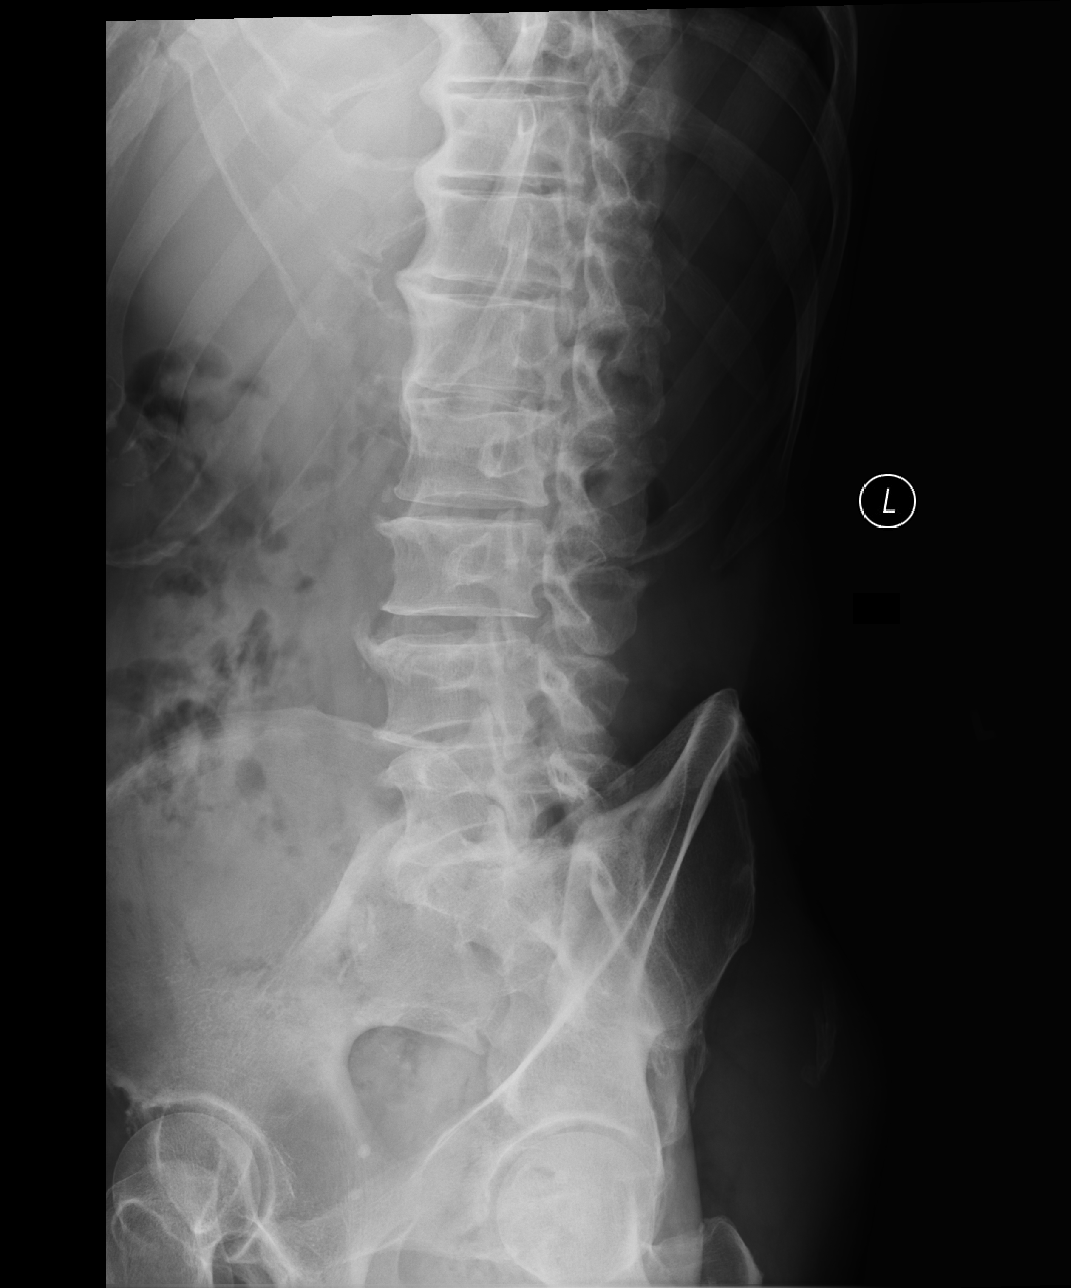
[im 4/4]
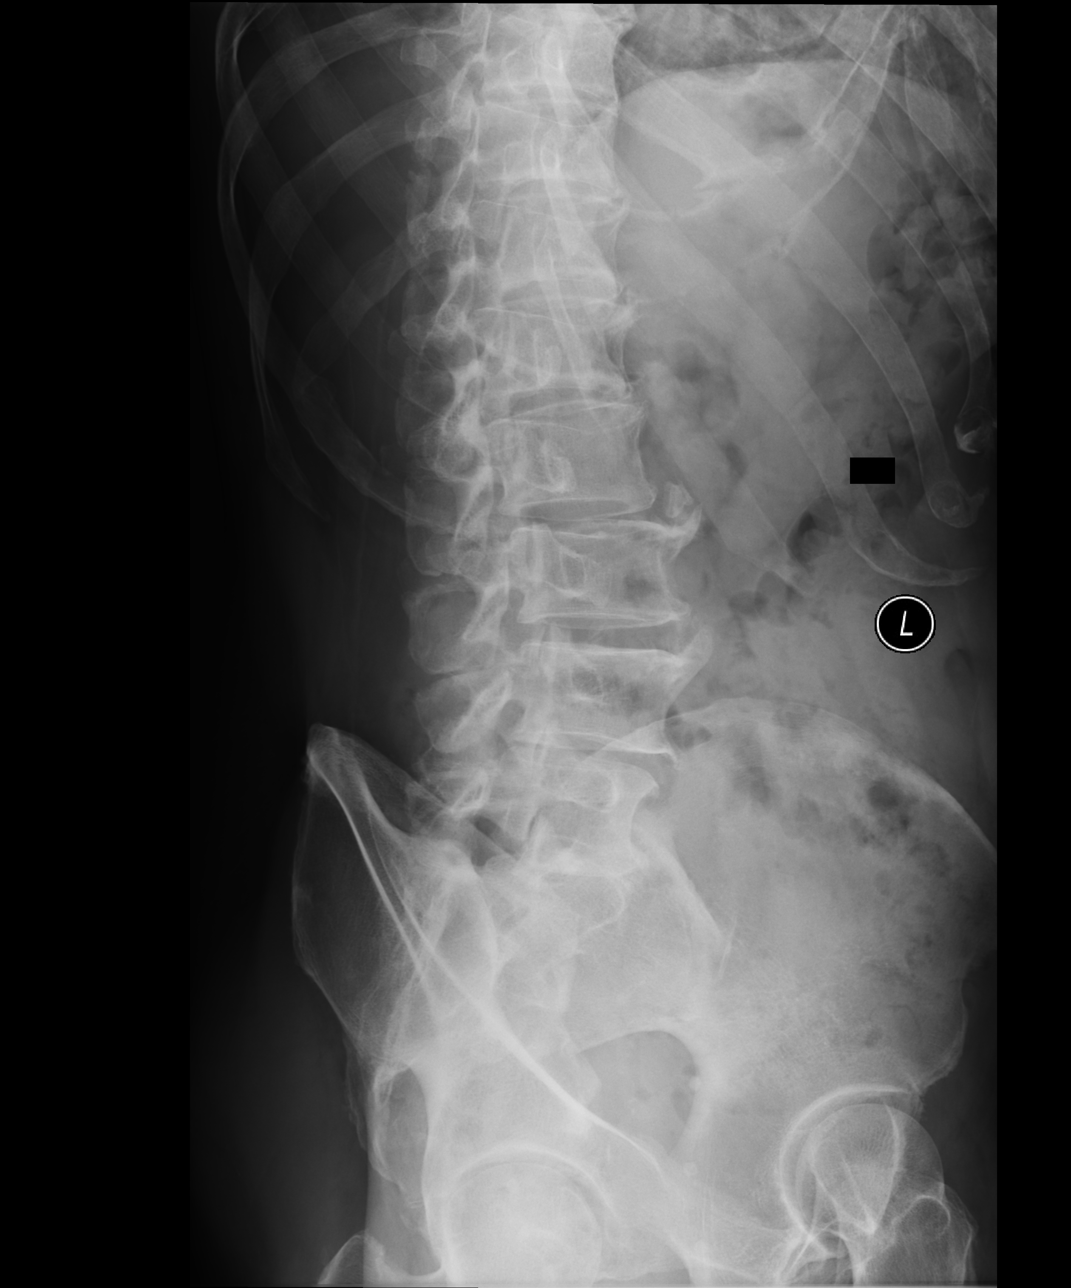

[4 of 4 positions shown; findings below may reference images not displayed]

FINDINGS: Lumbar vertebral heights are intact. There is slight anterolisthesis 
at L4-5 with moderate disc narrowing. Mild to moderate disc narrowing at L1-2 
and L2-3. Slight lumbar levocurvature. Facet degenerative changes throughout the 
lumbar spine. Mild SI joint degenerative change. Oblique views show no 
spondylolysis. Right lateral osteophyte at L1-2, anterior osteophytes most 
pronounced at L2-3 and L3-4.
IMPRESSION: Lumbar degenerative changes. No evidence for fracture. Slight anterolisthesis at 
L4-5. MRI would be useful for further evaluation.

## 2021-05-03 IMAGING — MR MRI LUMBAR SPINE WITHOUT CONTRAST
4 of 6 series · 14 of 48 positions shown · IV contrast (gadolinium)
Comparison: Lumbar radiographs April 18, 2021.

________________________________________________________________________________________________ 
MRI LUMBAR SPINE WITHOUT CONTRAST, 05/03/2021 [DATE]: 
CLINICAL INDICATION: 65-year-old male with low back pain with left leg 
radiculopathy.
TECHNIQUE: Multiplanar, multiecho position MR images of the lumbar spine were 
performed without intravenous gadolinium enhancement. Patient was scanned on a 
3T magnet.

[Series 101: survey · axial · 10.0mm · 1.39mm/px · z∈[-15,+199]mm · 4 of 9 slices shown]
[im 1/9]
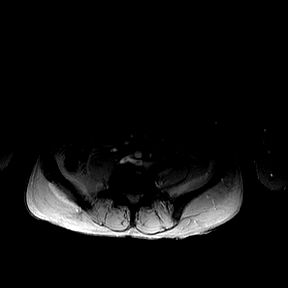
[im 3/9]
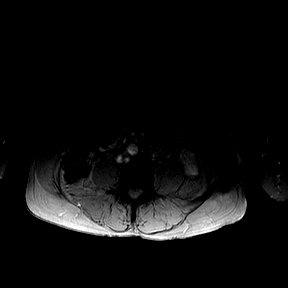
[im 6/9]
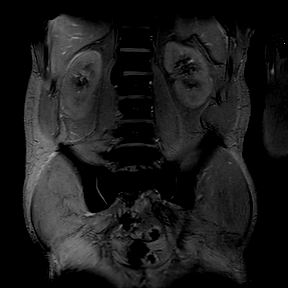
[im 9/9]
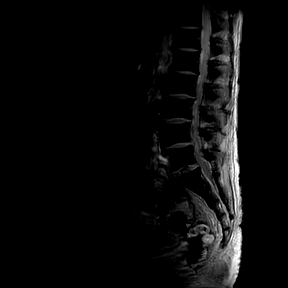

[Series 201: t2w_cor-surv · coronal · 6.0mm · 0.50mm/px · 2 of 5 slices shown]
[im 1/5]
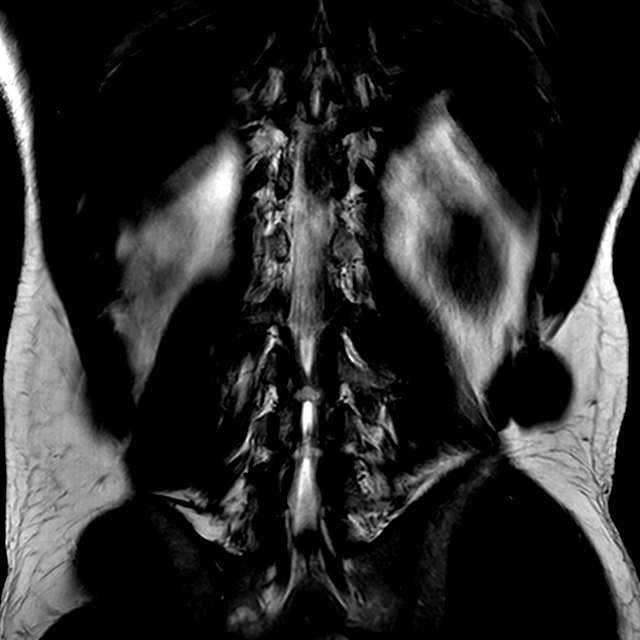
[im 5/5]
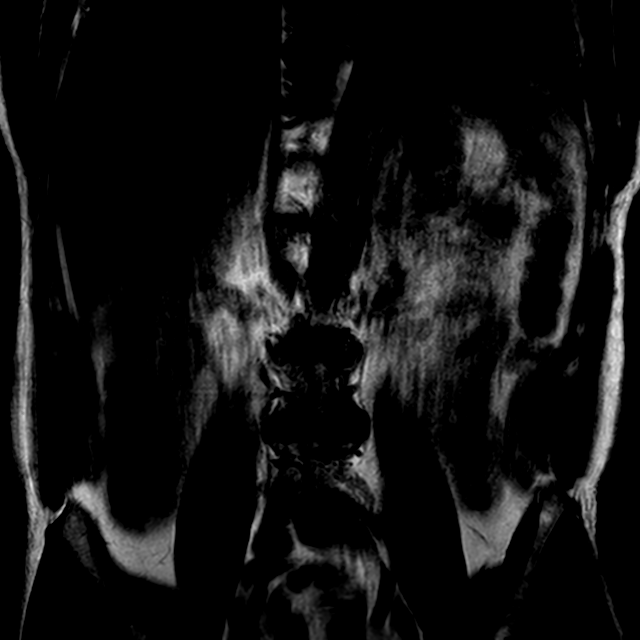

[Series 301: t2w_tse sag · sagittal · 4.0mm · 0.35mm/px · 5 of 17 slices shown]
[im 1/17]
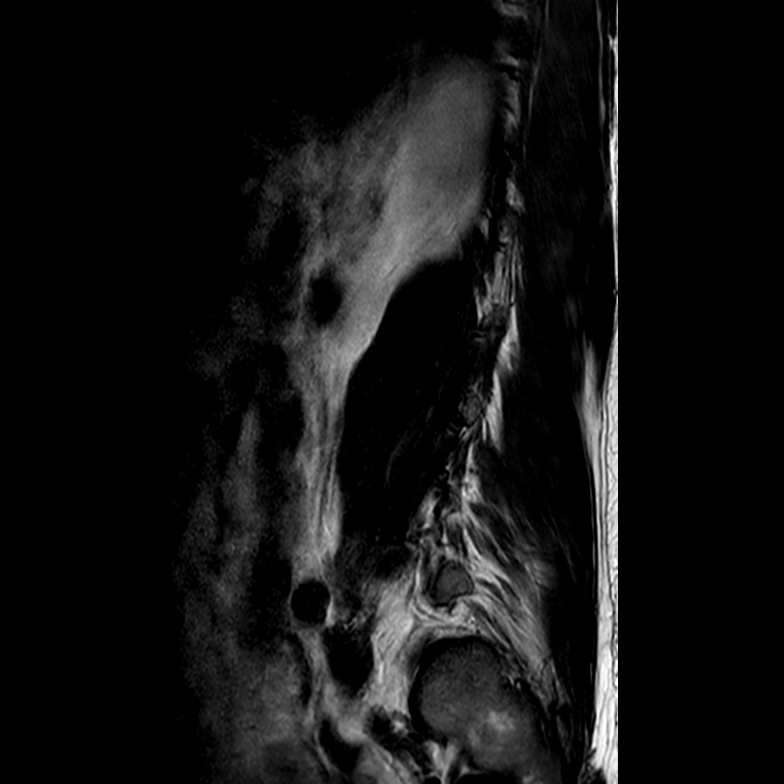
[im 3/17]
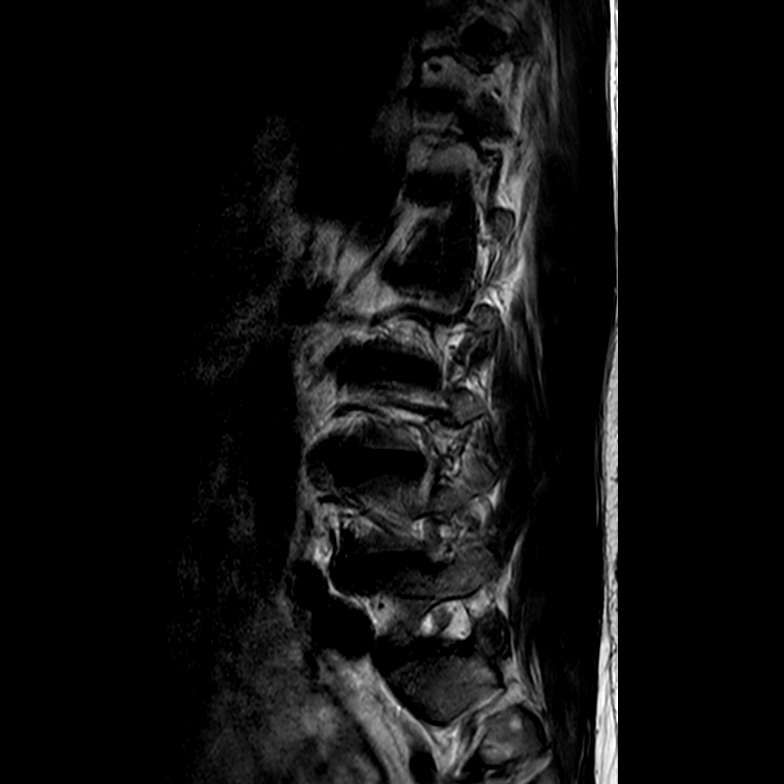
[im 5/17]
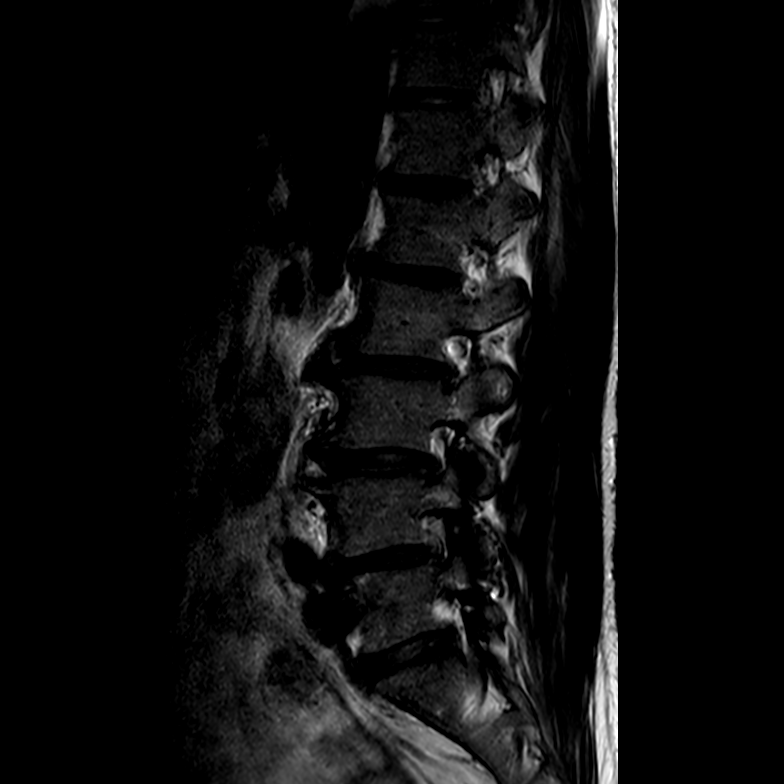
[im 9/17]
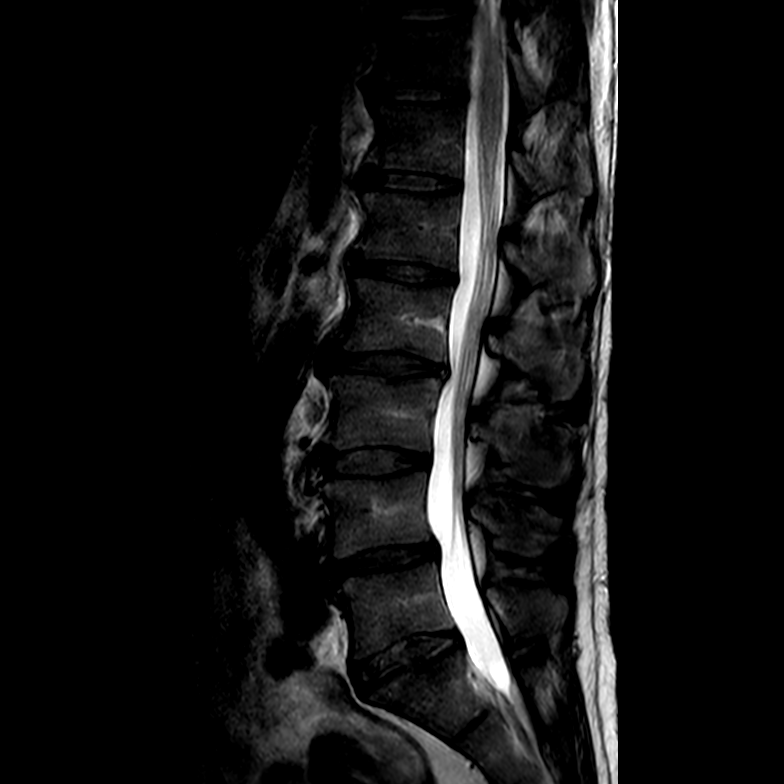
[im 15/17]
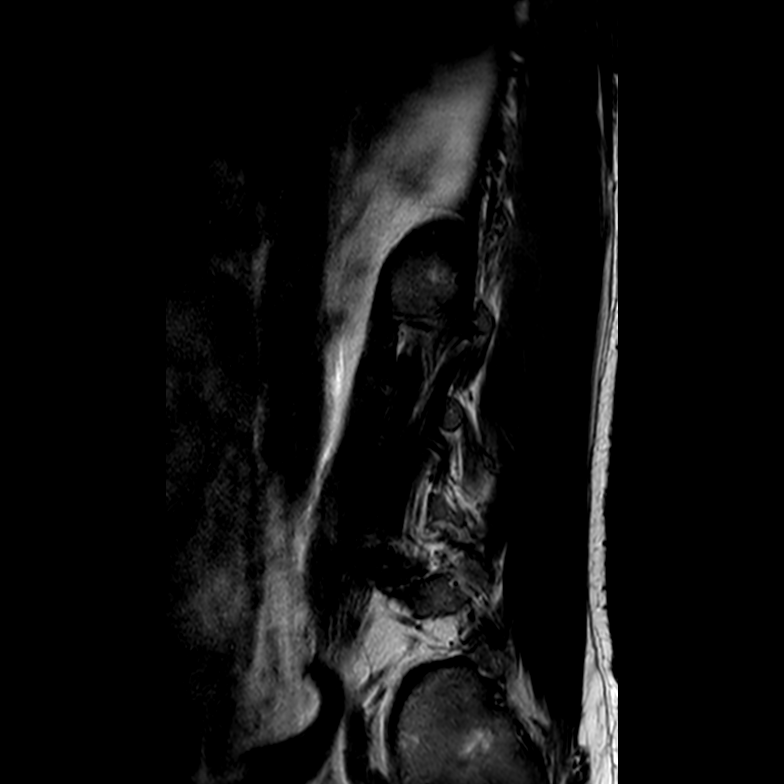

[Series 401: t1w_tse sag · sagittal · 4.0mm · 0.54mm/px · 3 of 17 slices shown]
[im 3/17]
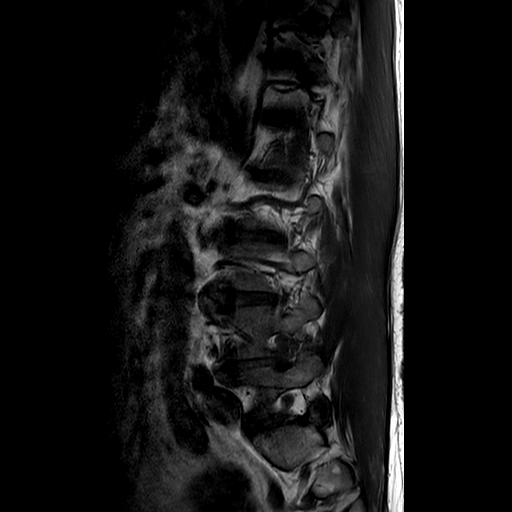
[im 9/17]
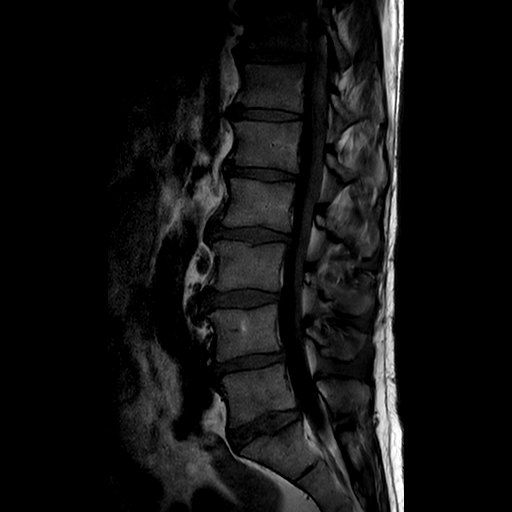
[im 15/17]
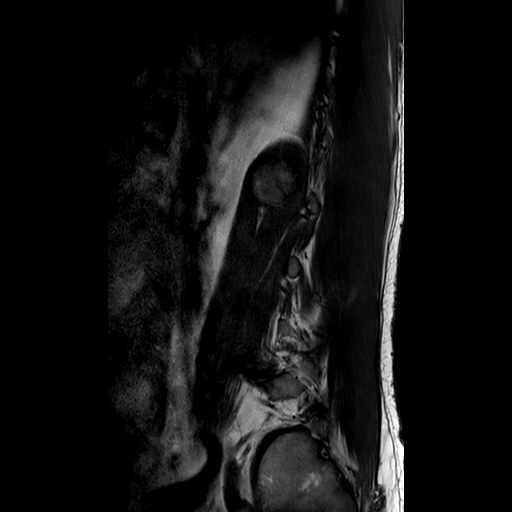

[14 of 48 positions shown; findings below may reference images not displayed]

FINDINGS: -------------------------------------------------------------------------------- 
------ 
GENERAL: 
5 lumbar type vertebral bodies. Minimal dextroconvex thoracic lumbar scoliosis 
apex to the right at L1-L2. There is 1 mm anterolisthesis L4 on L5. Otherwise 
there is anatomic sagittal alignment. L4 hemangioma. No marrow infiltrative 
process. Vertebral body height preserved without fracture. No marrow or 
ligamentous edema. Conus ends at the superior L1 level. Cauda equina 
unremarkable. No paraspinous abnormality.     
Modic I-II: None. 
Ligamentum Flavum > 2.5 mm: All levels. 
-------------------------------------------------------------------------------- 
------ 
SEGMENTAL: 
T11-T12: There is slight loss of disc signal. Mild loss of disc height. 
Otherwise normal. 
T12-L1: Slight loss of disc height and disc signal. Canal and foramina are 
patent. Normal facets. 
L1-L2: Mild loss of disc height with loss of disc signal. Canal and foramina are 
patent. Right foramen is mildly narrowed with evidence of a right foraminal disc 
protrusion which abuts the exiting right L1 nerve root. Normal facets. 
L2-L3: Mild loss of disc height and disc signal with anterior marginal 
osteophytes. Mild annular bulge with flattening of the ventral thecal sac and 
mild canal stenosis. Mild foraminal narrowing bilaterally. Mild facet 
arthropathy with small bilateral facet joint effusions. 
L3-L4: Slight loss of disc height with loss of disc signal. Anterior marginal 
osteophytes. Canal and right foramen are patent. Left foramen is mildly narrowed 
with small left foraminal disc protrusion. Mild facet arthropathy. 
L4-L5: Moderate loss of disc height with loss of disc signal. Anterior marginal 
osteophytes. Mild annular bulge with mild canal stenosis and narrowing of the 
left lateral recess. Evidence of a left foraminal disc extrusion, see image 5 of 
series 301 with moderate left foraminal narrowing. This may be affecting the 
exiting left L4 nerve root. Correlate clinically. Right foramen is mild to 
moderately narrowed as well. Facet arthropathy. 
L5-S1: Loss of disc signal with mild annular bulge. Canal patent. Foramina 
patent. Facet arthropathy. 
-------------------------------------------------------------------------------- 
------
IMPRESSION: L4-L5, left foraminal disc extrusion with moderate left foraminal narrowing, may 
be affecting the exiting left L4 nerve root. Correlate clinically. Mild to 
moderate right foraminal narrowing. Mild canal stenosis. 
No significant or critical central canal narrowing. 
Other, less significant degenerative and scoliotic changes as detailed above.

## 2022-01-28 IMAGING — CT CT RIGHT FEMUR WITHOUT CONTRAST
3 series · 9 of 33 positions shown, 10 images · non-contrast
Comparison: None

________________________________________________________________________________________________ 
CT RIGHT FEMUR WITHOUT CONTRAST, 01/28/2022 [DATE]: 
CLINICAL INDICATION: Pain in right leg 
A search for DICOM formatted images was conducted for prior CT imaging studies 
completed at a non-affiliated media free facility.
TECHNIQUE: The right femur was scanned without contrast on a high resolution low 
dose CT scanner. Routine MRI reconstruction images were performed. Count of 
known CT and Cardiac Nuclear Medicine studies performed in the previous 12 
months = 0.

[Series 3: rt femur st · axial · 0.52mm/px · z∈[-192,-192]mm · 1 of 643 slices shown, 2 images]
[im 346/643  soft-tissue]
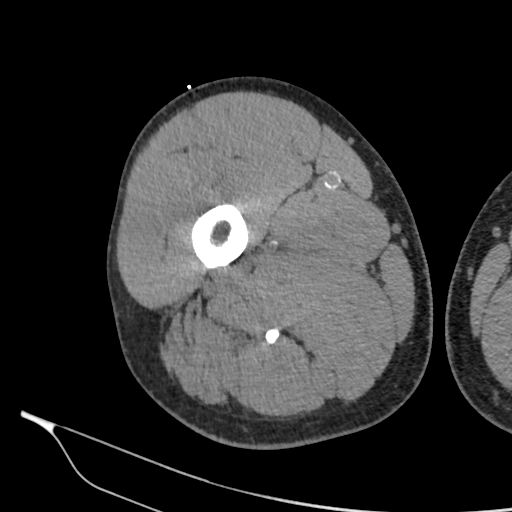
[im 346/643  bone]
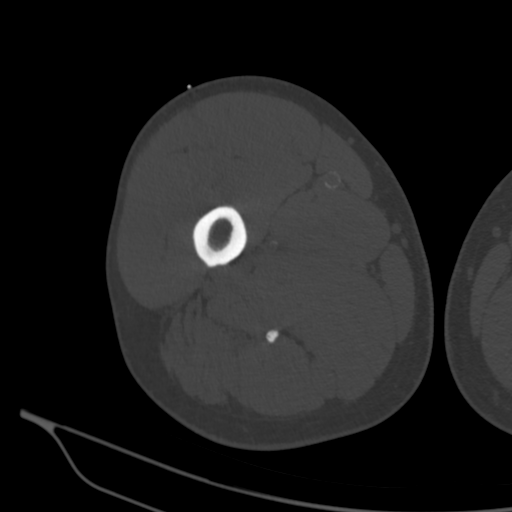

[Series 5: rt femur bone coronal · coronal · 0.58mm/px · 3 of 135 slices shown]
[im 27/135  bone]
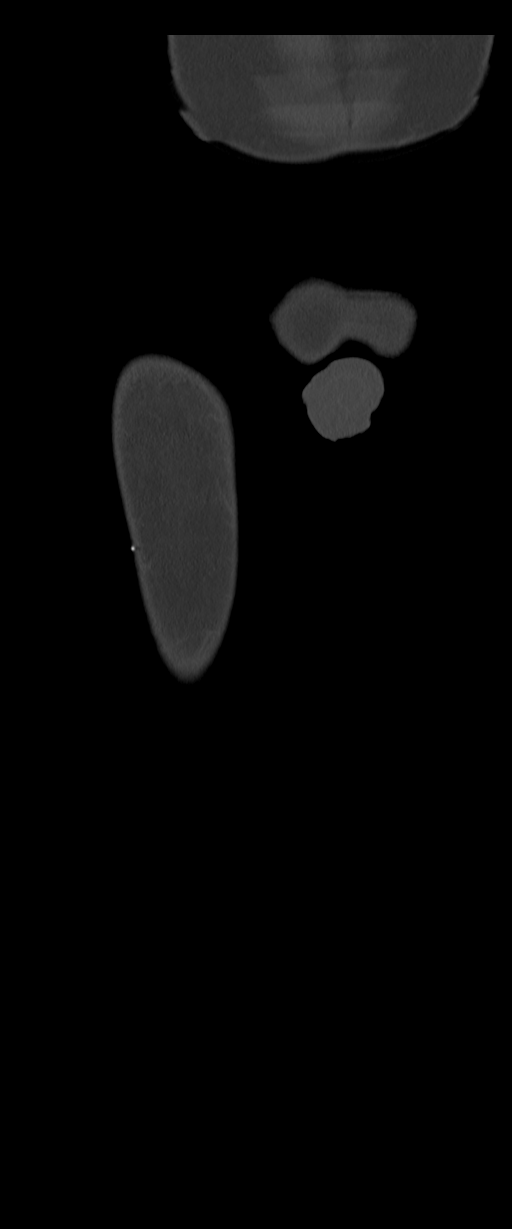
[im 54/135  bone]
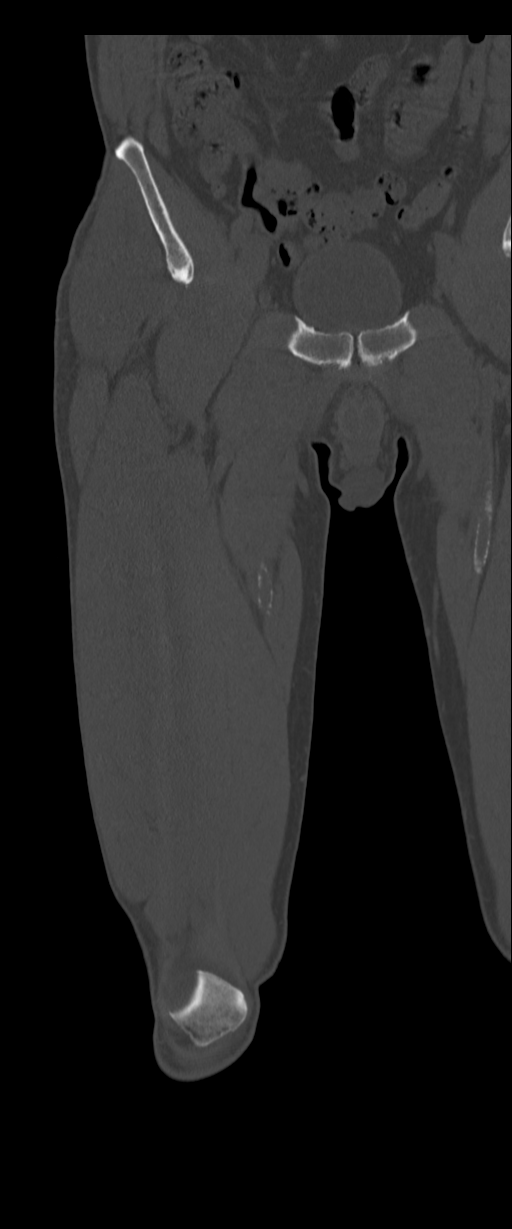
[im 81/135  bone]
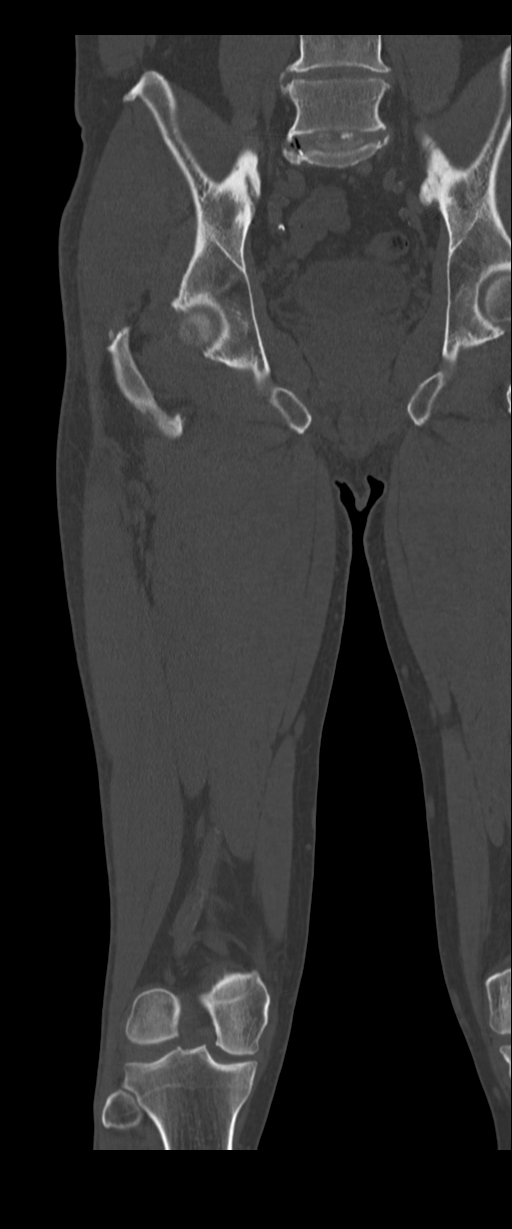

[Series 6: rt femur bone sag · sagittal · 0.63mm/px · 5 of 139 slices shown]
[im 47/139  bone]
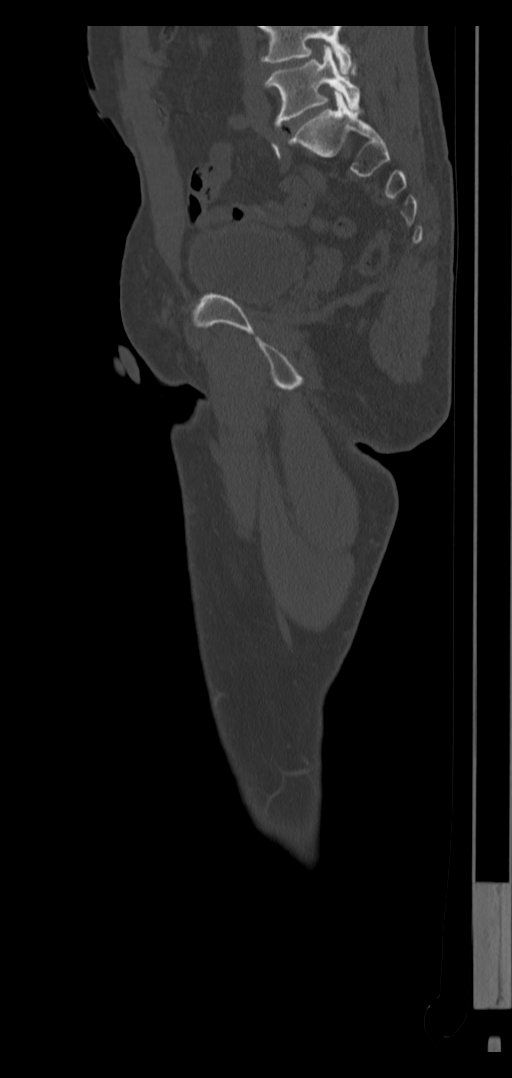
[im 58/139  bone]
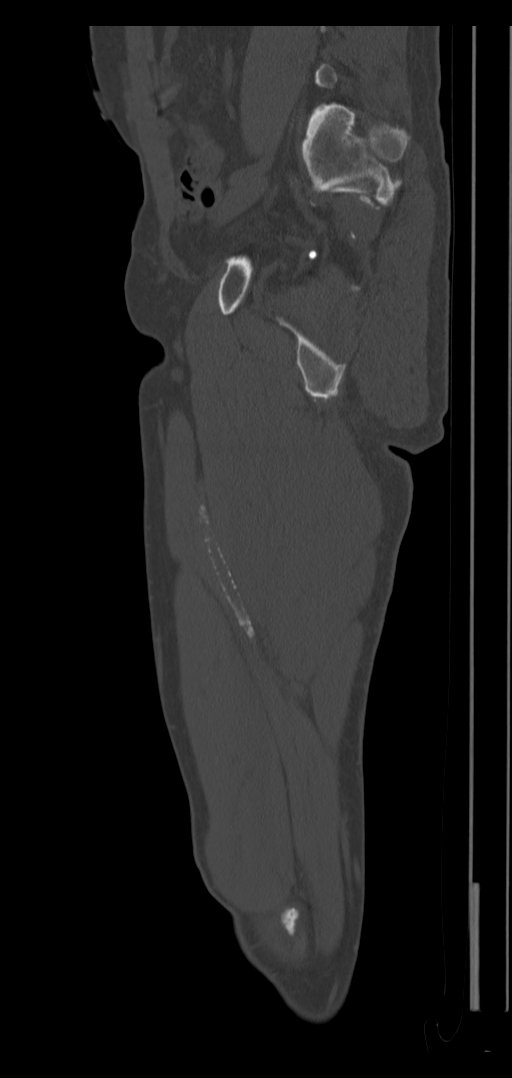
[im 70/139  bone]
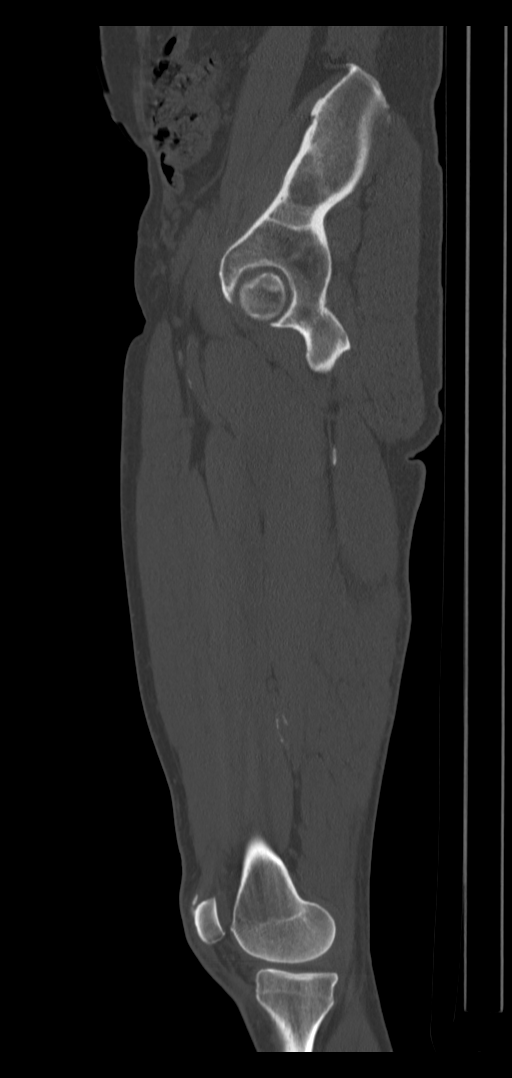
[im 81/139  bone]
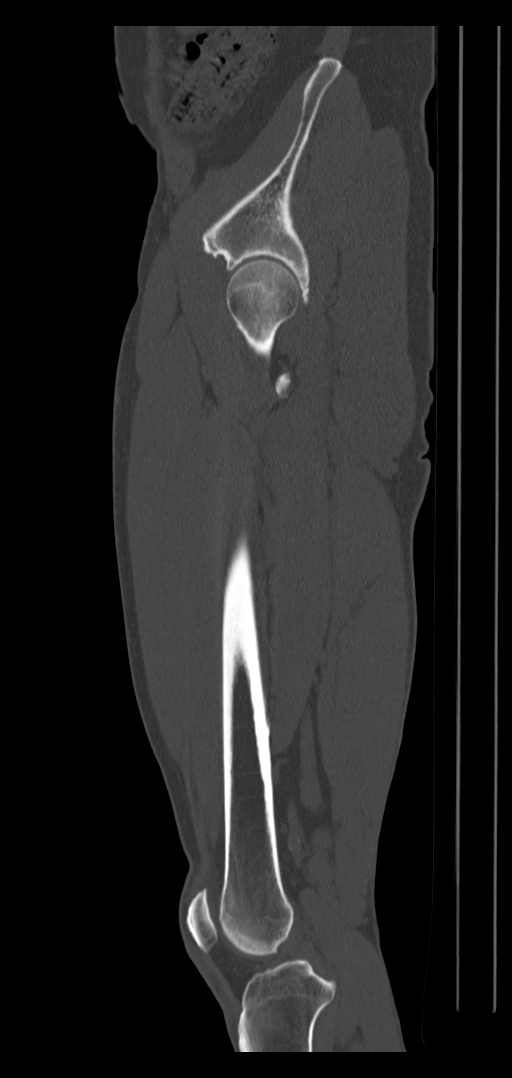
[im 93/139  bone]
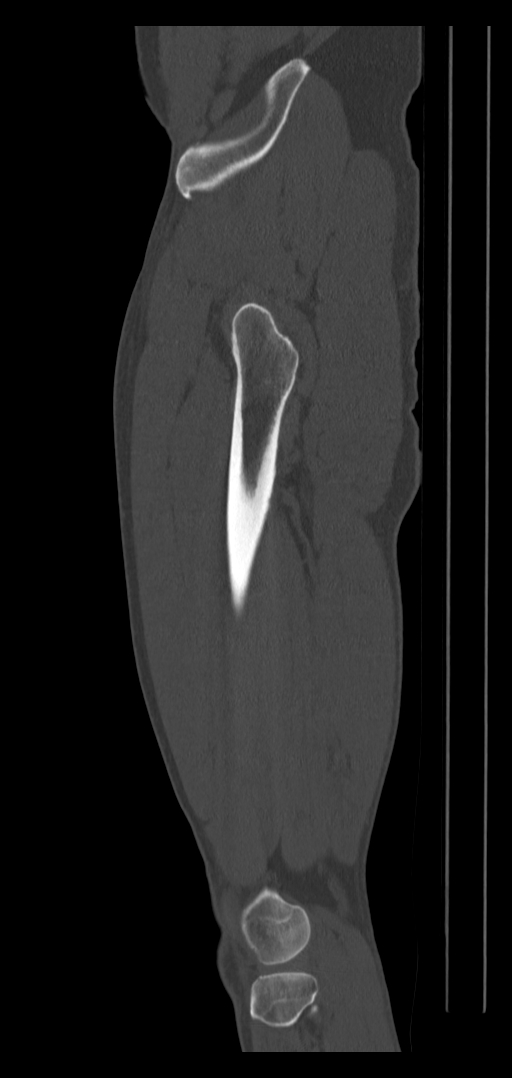

[9 of 33 positions shown; findings below may reference images not displayed]

FINDINGS: BONES: Normal. 
JOINTS: Mild degenerative change of the hip and knee. Mild degenerative change 
of the pubic symphysis. Degenerative change and partial ankylosis of the SI 
joints. Multilevel degenerative change of the spine. 
MUSCLES/TENDONS: Multifocal well-corticated heterotopic ossification along the 
proximal/mid right hamstring tendons (largest focus measures 1.1 x 1.0 x
cm), consistent with remote injury. 
ADDITIONAL FINDINGS: Lateral/anterolateral proximal thigh skin markers indicate 
the site of symptoms. No underlying lesion. No mass or fluid collection. 
Atherosclerosis.
IMPRESSION: 1.  Multifocal well-corticated heterotopic ossification along the proximal/mid 
right hamstring tendons, consistent with remote injury. 
2.  Mild degenerative change of the hip, knee and pubic symphysis.  
3.  Degenerative change/partial ankylosis of the SI joints and multilevel 
degenerative change of the spine. 
4.  Atherosclerosis. 
RADIATION DOSE REDUCTION: All CT scans are performed using radiation dose 
reduction techniques, when applicable.  Technical factors are evaluated and 
adjusted to ensure appropriate moderation of exposure.  Automated dose 
management technology is applied to adjust the radiation doses to minimize 
exposure while achieving diagnostic quality images.

## 2022-02-25 IMAGING — MR MRI RIGHT HIP WITHOUT CONTRAST
4 of 6 series · 18 of 40 positions shown · IV contrast (gadolinium)
Comparison: 01/28/2022 right femur CT

________________________________________________________________________________________________ 
MRI RIGHT HIP WITHOUT CONTRAST, 02/25/2022 [DATE]: 
CLINICAL INDICATION: Other neoplasm of uncertain behavior of connective/soft 
tissue.
TECHNIQUE: Multiplanar, multiecho position MR images of the hip were performed 
without intravenous gadolinium enhancement. Large field-of-view images were 
performed of the pelvis to include the contralateral hip for comparison. Patient 
was scanned on a 3T magnet.

[Series 201: T1 · axial · 5.0mm · 0.62mm/px · z∈[-111,+128]mm · 3 of 50 slices shown]
[im 6/50]
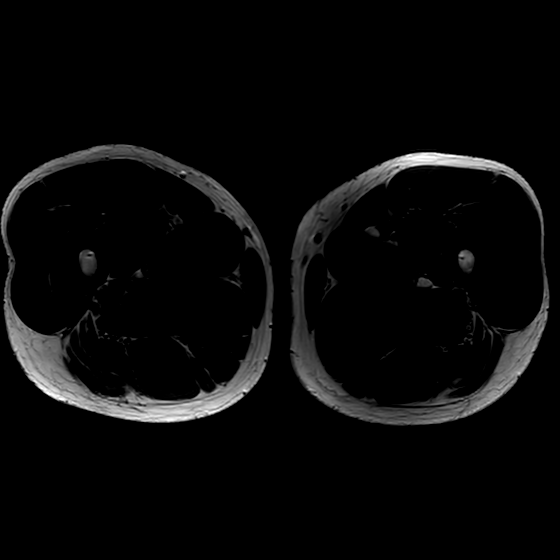
[im 28/50]
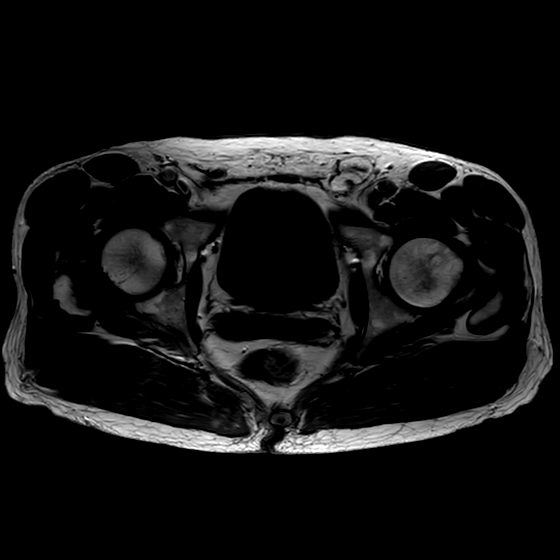
[im 44/50]
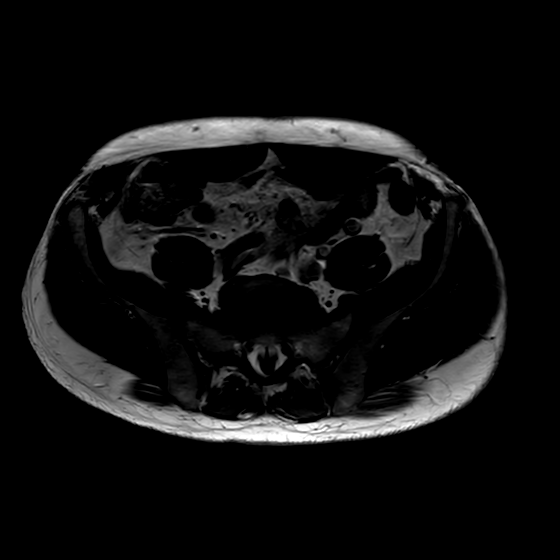

[Series 401: PD fat-sat · axial · 4.0mm · 0.35mm/px · z∈[-97,+73]mm · 8 of 38 slices shown (1 of 2)]
[im 1/38]
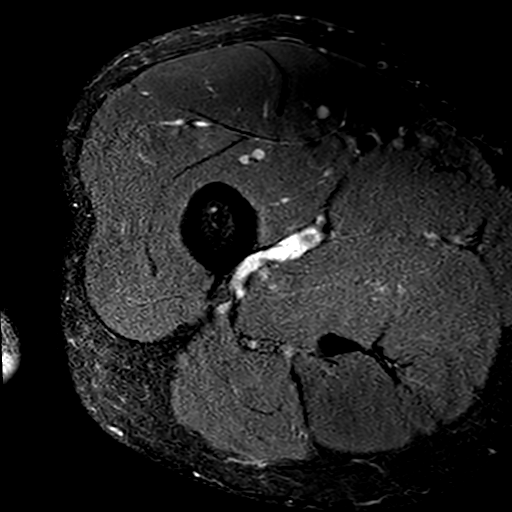
[im 6/38]
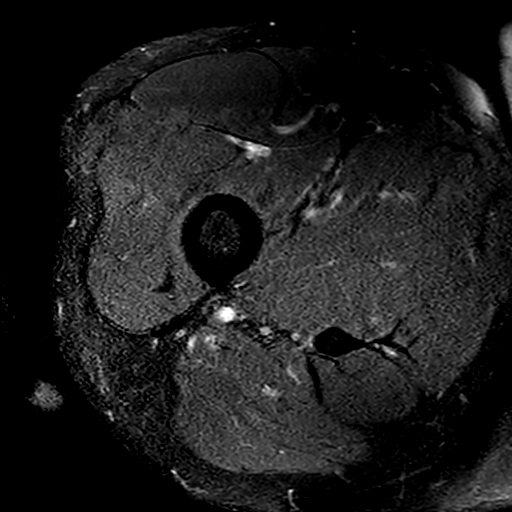
[im 11/38]
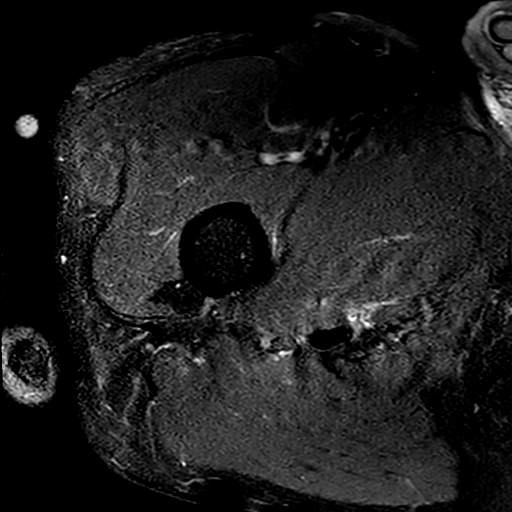
[im 16/38]
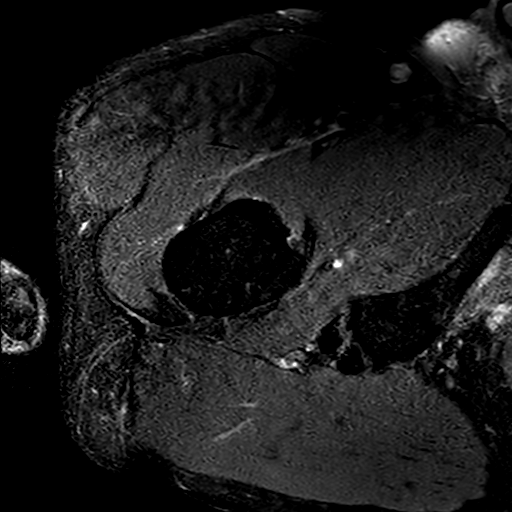
[im 22/38]
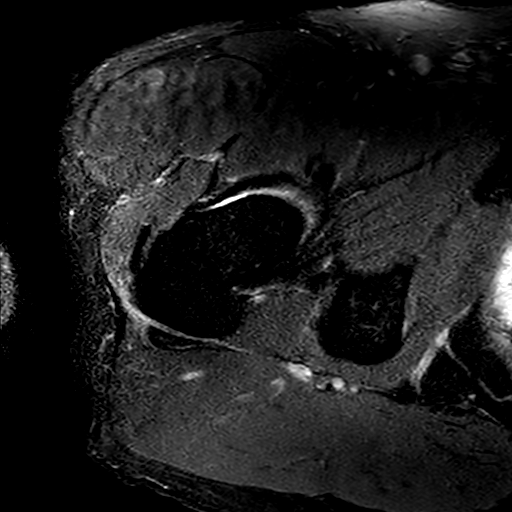
[im 27/38]
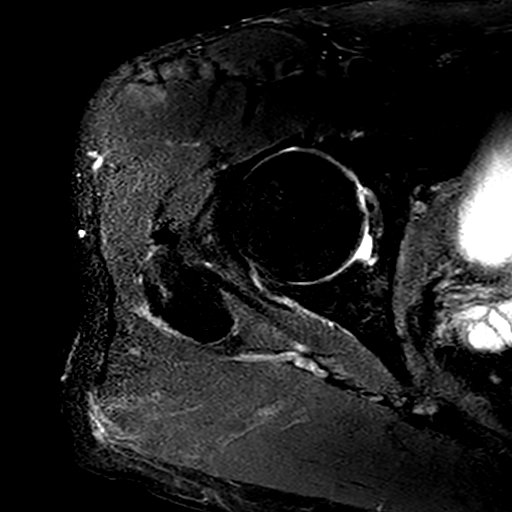
[im 32/38]
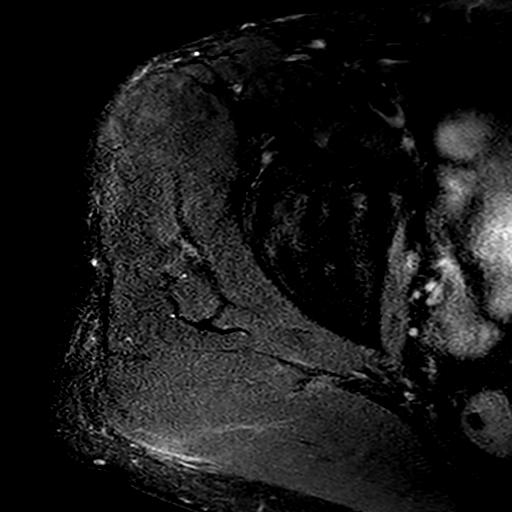
[im 38/38]
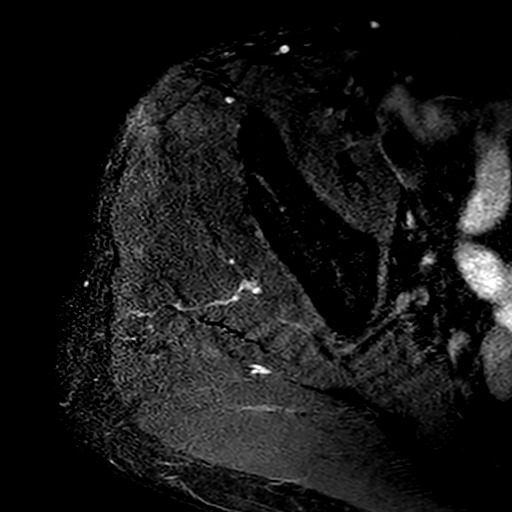

[Series 501: PD · sagittal · 3.5mm · 0.43mm/px · 3 of 36 slices shown]
[im 6/36]
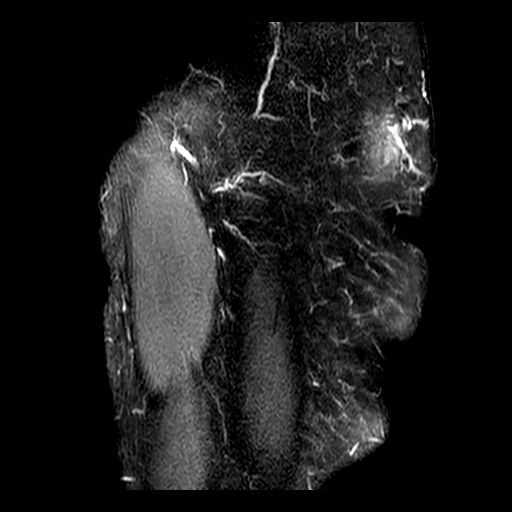
[im 21/36]
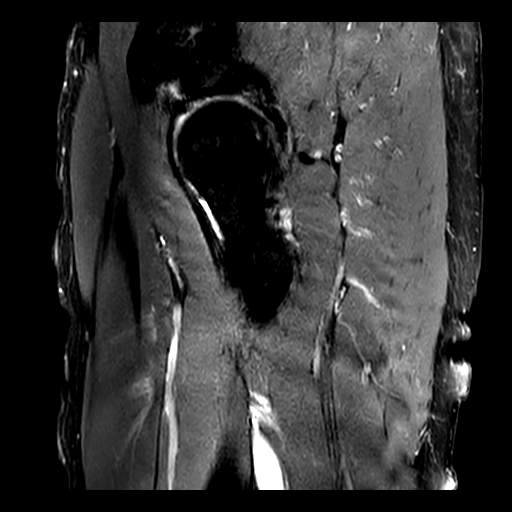
[im 31/36]
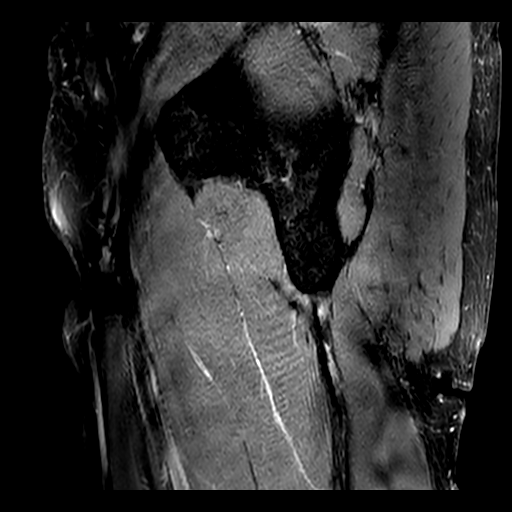

[Series 601: PD fat-sat · coronal · 3.2mm · 0.43mm/px · 4 of 20 slices shown (2 of 2)]
[im 1/20]
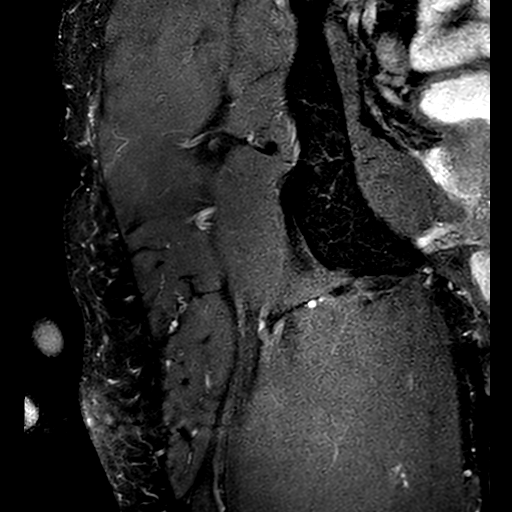
[im 7/20]
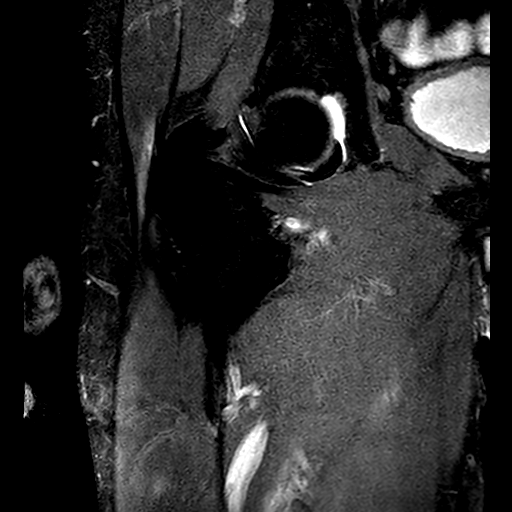
[im 13/20]
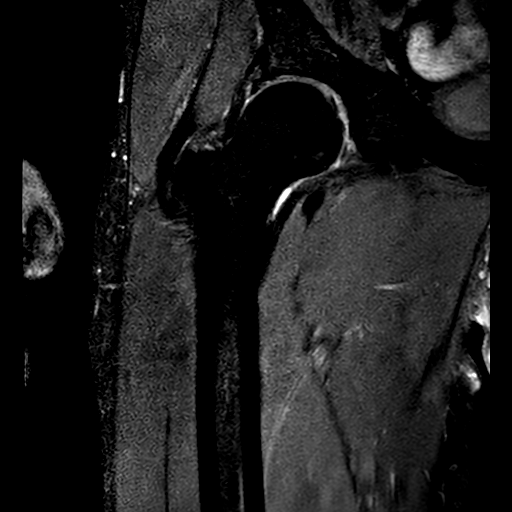
[im 20/20]
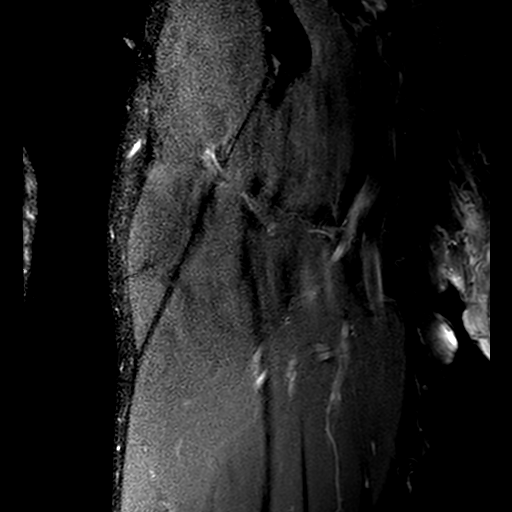

[18 of 40 positions shown; findings below may reference images not displayed]

FINDINGS: HIPS:  Mild degenerative change of the hips with partial thickness 
chondromalacia. No labral tear. No paralabral cyst. No hip joint effusion. Both 
femoral heads maintain a spherical configuration without evidence of avascular 
necrosis or subarticular collapse. No abnormal morphology of the proximal femurs 
or acetabula to predispose to impingement.  
BONES: Normal marrow signal intensity of the proximal hips, pelvis, sacrum and 
included lower lumbar spine. No fracture, contusion or marrow replacing lesion. 
Degenerative change of the SI joints and spine. 
SOFT TISSUES: Right anterolateral pelvic skin marker indicates the site of 
symptoms. This overlies the right tensor fascia lata muscle. The right tensor 
fascia lata muscle is larger than the left but maintains its normal signal 
intensity. No underlying soft tissue mass. Stable heterotopic ossification along 
the right proximal hamstring tendons, consistent with remote injury. Bilateral 
partial thickness tears and tendinosis of the gluteus medius/minimus tendons 
with minimal peritendinous/peritrochanteric edema. Small fat-containing left 
inguinal hernia. The insertions of the iliopsoas tendons are intact. Rectus 
abdominis-adductor complex is preserved. No focal fluid collection or distended 
bursa. Specifically, no iliopsoas or trochanteric bursitis. Included 
neurovascular bundles are negative. Small hydroceles.
IMPRESSION: 1.  No soft tissue mass. The right anterolateral pelvic palpable finding 
corresponds to prominent right tensor fascia lata muscle.  
2.  Mild degenerative change of the hips. 
3.  Review right proximal hamstring tendon injury. 
4.  Bilateral partial thickness tears/tendinosis of the gluteus medius/minimus 
tendons. 
5.  Small fat-containing left inguinal hernia.  
6.  Degenerative change of the SI joints and spine.

## 2022-09-13 ENCOUNTER — Other Ambulatory Visit: Payer: Self-pay | Admitting: Gastroenterology

## 2022-09-13 IMAGING — CT CT CALCIUM SCORING
1 series · 15 of 20 positions shown, 19 images · non-contrast
Comparison: There are no previous exams available for comparison.

________________________________________________________________________________________________ 
CT CALCIUM SCORING, 09/13/2022 [DATE]:
INDICATION: Encounter for screening for cardiovascular disorders

[Series 2: cascoreseq 3.0 b35s 60% · axial · 0.35mm/px · z∈[-251,-98]mm · 15 of 114 slices shown, 19 images]
[im 6/114  vessel]
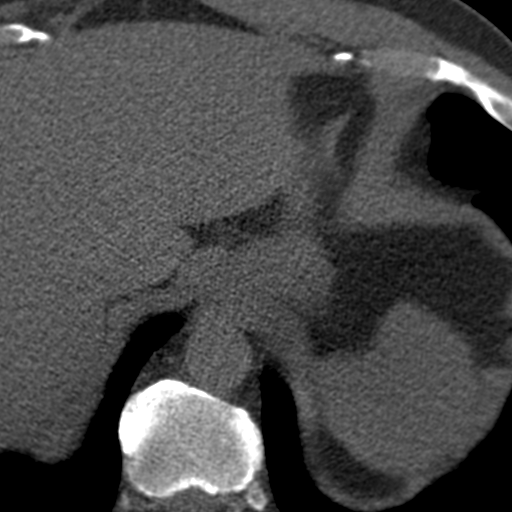
[im 6/114  lung]
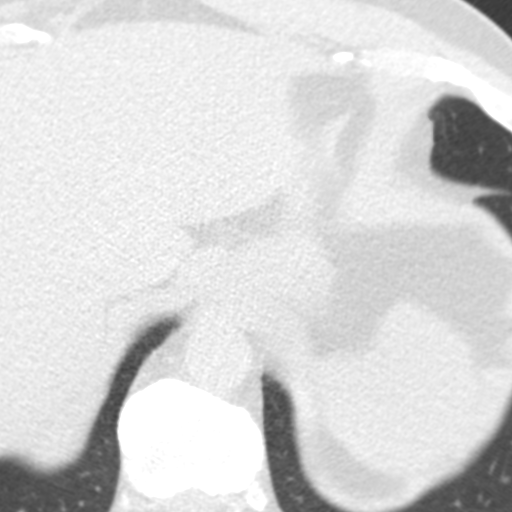
[im 12/114  vessel]
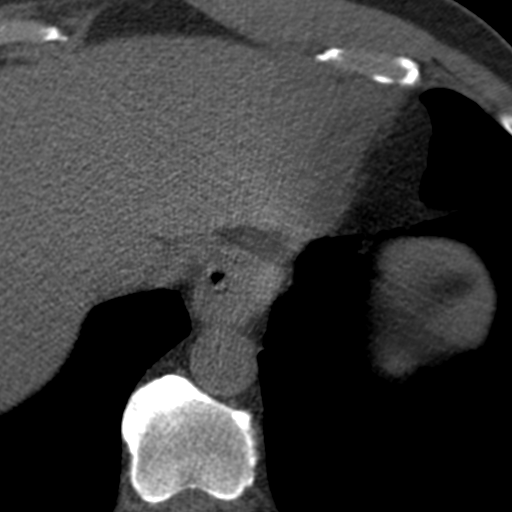
[im 24/114  vessel]
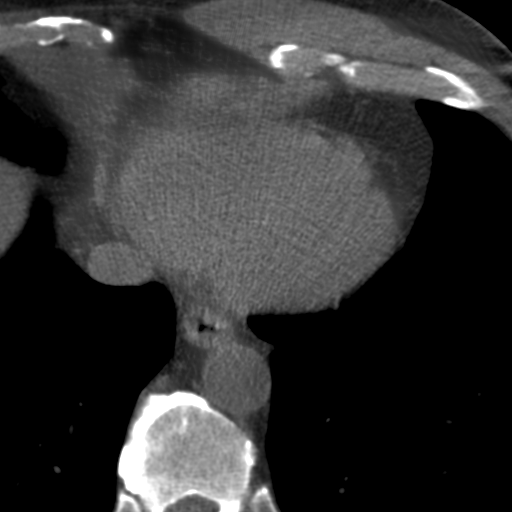
[im 30/114  vessel]
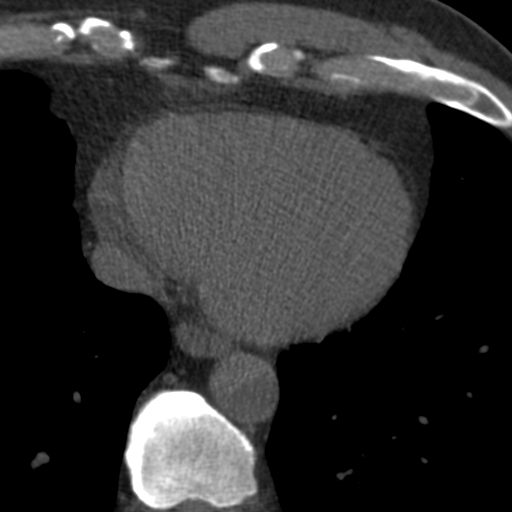
[im 36/114  vessel]
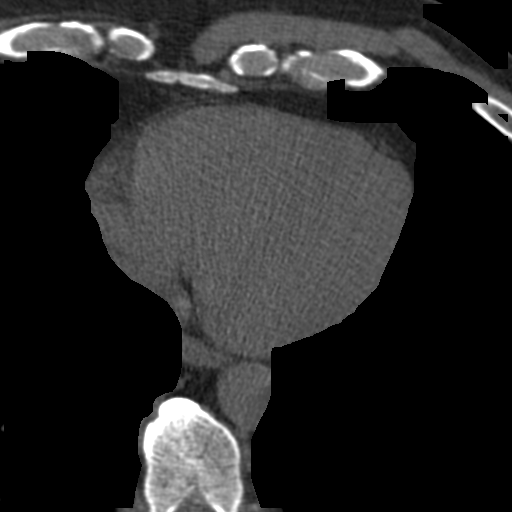
[im 36/114  lung]
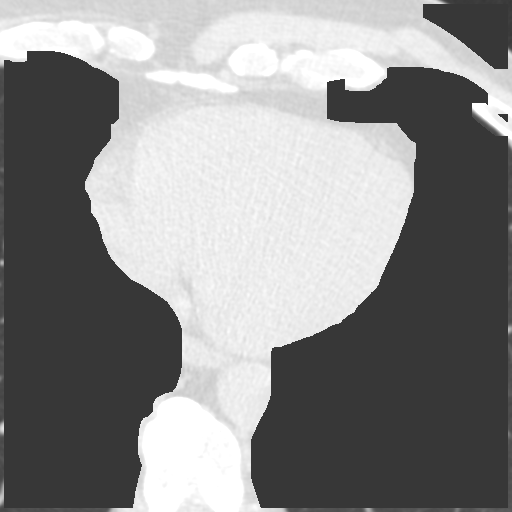
[im 42/114  vessel]
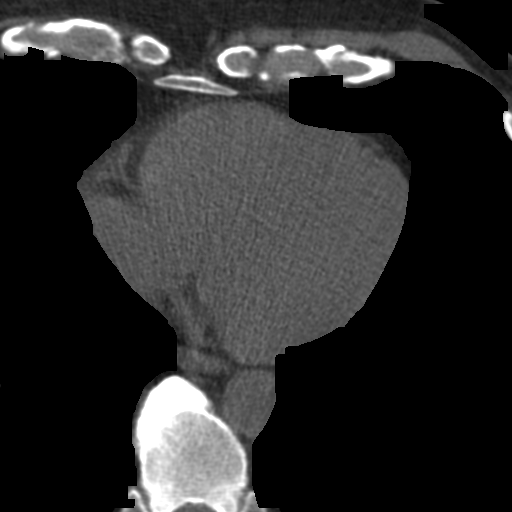
[im 48/114  vessel]
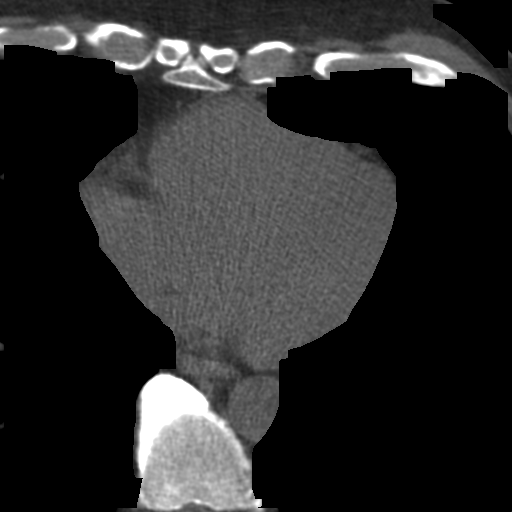
[im 60/114  vessel]
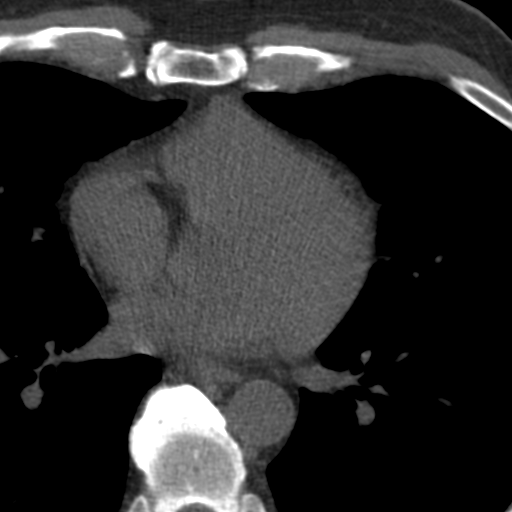
[im 66/114  vessel]
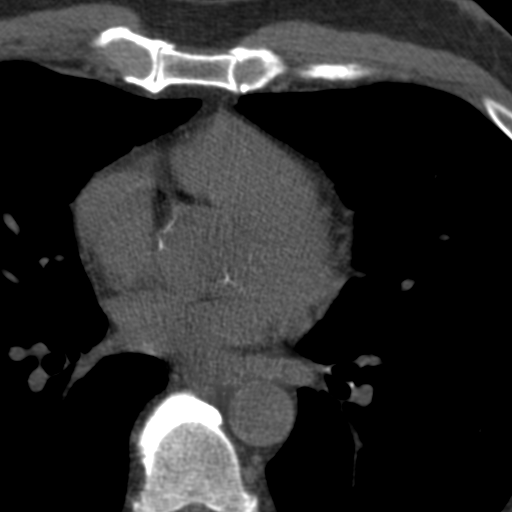
[im 66/114  lung]
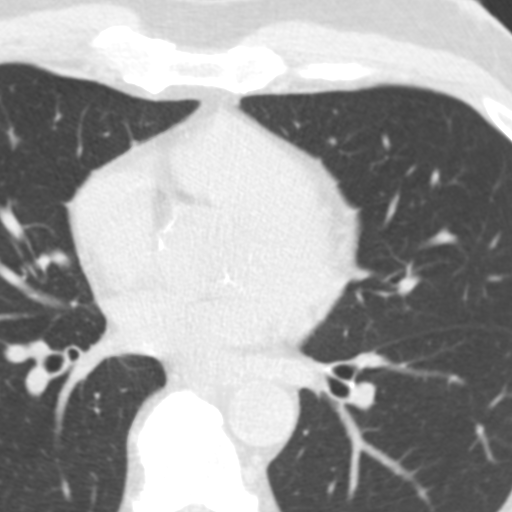
[im 72/114  vessel]
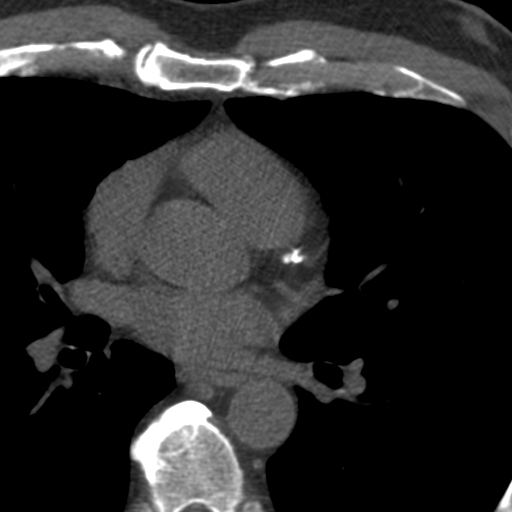
[im 78/114  vessel]
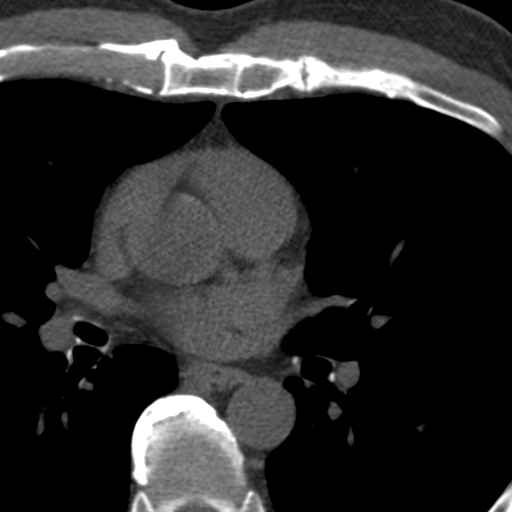
[im 84/114  vessel]
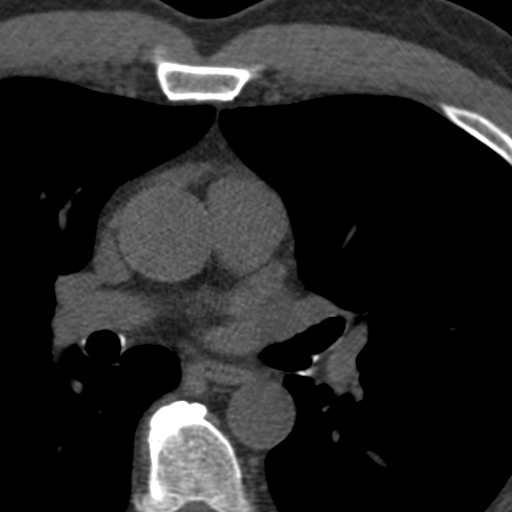
[im 96/114  vessel]
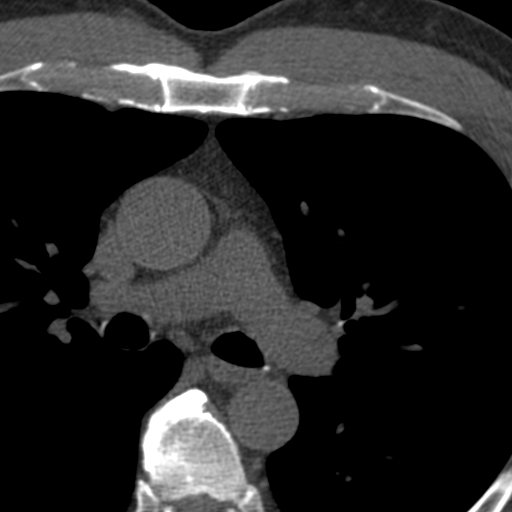
[im 96/114  lung]
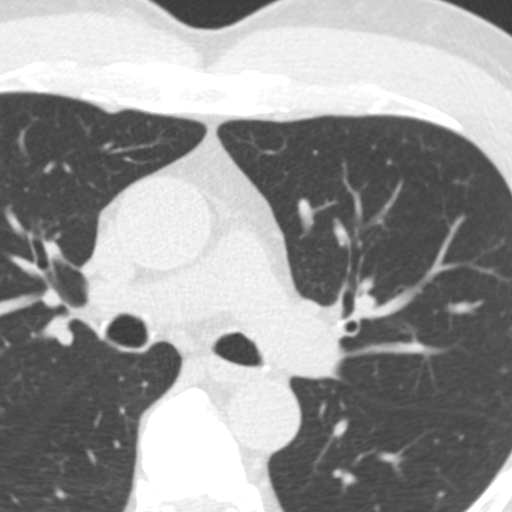
[im 102/114  vessel]
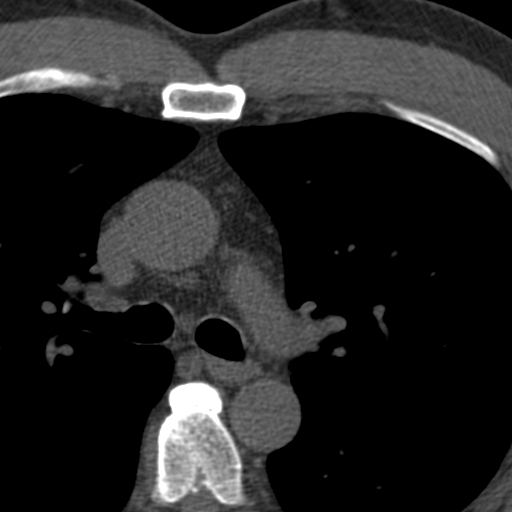
[im 108/114  vessel]
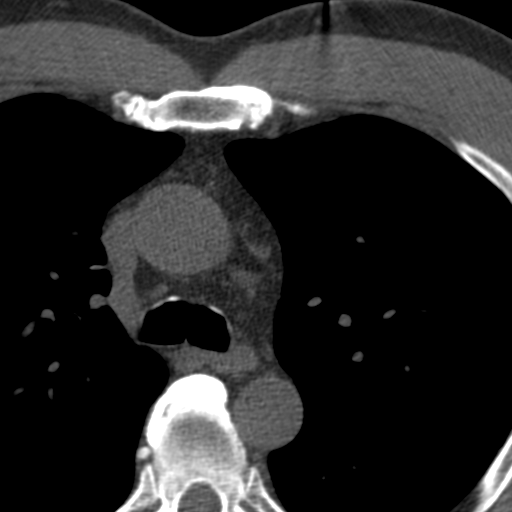

[15 of 20 positions shown; findings below may reference images not displayed]

Count of known CT and Cardiac Nuclear Medicine studies performed in the previous 
12 months = 1
FINDINGS: Visualized lungs are clear.
IMPRESSION: Left Main (LM) Score: 0 
Left Anterior Descending Artery (LAD) Score: 253 
Circumflex (CM) Score: 0 
Right Coronary Artery (RCA) Score: 6 
Posterior Descending Artery (PDA) Score: 0 
Other: 0
IMPRESSION: Total Calcium Score: 259 
No ancillary findings. 
If calcium score is greater than 100, and the patient is symptomatic with chest 
pain or shortness of breath, would recommend follow-up with coronary CTA with 
[place] plaque analysis. 
Calcium scoring sheets to follow. 
RADIATION DOSE REDUCTION: All CT scans are performed using radiation dose 
reduction techniques, when applicable.  Technical factors are evaluated and 
adjusted to ensure appropriate moderation of exposure.  Automated dose 
management technology is applied to adjust the radiation doses to minimize 
exposure while achieving diagnostic quality images. 
Consider further evaluation if multi-vessel or left-main predominant disease is 
present. 
Calcium score                                     Presence of Plaque 
0                                                    No evidence of plaque 
1-10                                               Minimal evidence of plaque 
11-100                                            Mild evidence of plaque 
101-400                                          Moderate evidence of plaque 
Over 400                                        Extensive evidence of plaque

## 2022-09-20 ENCOUNTER — Telehealth: Payer: Self-pay | Admitting: Cardiology

## 2022-09-20 NOTE — Telephone Encounter (Signed)
Pt called to get Dr. Assunta Gambles opinion on his most recent Ct calcium score and his advice on what the pt should do. Pt would like a call to discuss this. Image is in pt's chart

## 2022-09-25 NOTE — Telephone Encounter (Signed)
Advised patient of recommendation she verbalized understanding.

## 2022-09-25 NOTE — Telephone Encounter (Signed)
Patient's coronary calcium score is similar to what he had in the past just slightly better.    He has not been seen in a couple years and if he would like to discuss things please have him come into the office for an appointment with Dr. Smith Robert or myself.

## 2022-09-26 NOTE — Telephone Encounter (Signed)
Left voicemail for patient to call back to be scheduled

## 2022-10-01 NOTE — Telephone Encounter (Signed)
Patient returning call.

## 2022-10-02 NOTE — Telephone Encounter (Signed)
Left voicemail for patient to return call.

## 2022-10-17 ENCOUNTER — Telehealth: Payer: Self-pay | Admitting: Cardiology

## 2022-10-17 NOTE — Telephone Encounter (Signed)
Pt was returning call from team 10 about scheduling an appt. He was hoping this would be virtual as he lives in [place] now. Please advise.    See encounter from 8/30

## 2022-10-17 NOTE — Telephone Encounter (Signed)
Please advise if this can be a video visit/patient has not been seen since 5/22

## 2022-10-17 NOTE — Telephone Encounter (Signed)
Yes we can do a virtual .    However going forward if he is living in [place] then eventually he may want to establish care with a cardiologist there.

## 2022-10-21 NOTE — Telephone Encounter (Signed)
I have scheduled Alejandro Dennis for a telehome visit tomorrow 10/01 @ 2:20.  He has a cardiology consult with a cardiologist down in [place] in January.  He is aware this will just be a telehome visit and not a video.   DONE

## 2022-10-22 ENCOUNTER — Ambulatory Visit: Payer: BLUE CROSS/BLUE SHIELD | Admitting: Cardiology

## 2022-10-22 DIAGNOSIS — I10 Essential (primary) hypertension: Secondary | ICD-10-CM

## 2022-10-22 DIAGNOSIS — I6521 Occlusion and stenosis of right carotid artery: Secondary | ICD-10-CM

## 2022-10-22 DIAGNOSIS — E785 Hyperlipidemia, unspecified: Secondary | ICD-10-CM

## 2022-10-22 NOTE — Progress Notes (Signed)
[facility]        Cardiology Office Telephone Revisit Note    Date of Visit: 10/22/2022 Patient: Alejandro Dennis   Patients PCP: Jetty Peeks, MD Patient DOB: April 14, 1955  EMRN: [id number]     Subjective/Reason For Visit     I had the pleasure of seeing Alejandro Dennis in cardiology followup on 10/22/2022 via telephone.     Alejandro Dennis had a recent coronary calcium scoring study done in [place] at the recommendation of his friend.  It showed calcium score of 259 which is between the 50th and 75th percentile for males between the ages of 66 and 67.  His coronary calcium score in 2020 was 275 range which was close to 75th percentile..  Otherwise he states that he is doing well and is in fact getting 9000-10,000 steps a day of walking.  He would not experience any chest pains dyspnea palpitations lightheadedness syncope.   He had a normal stress echocardiogram in 2020 obtained when he had abnormal calcium scoring of the coronary arteries.  He also has carotid vessel disease and had an endarterectomy on right side in 2017.  Follow-up carotid Doppler study showed mild ICA stenosis on left side and noncritical ICA stenosis on the right side.  He has hypertension and hyperlipidemia.     Alejandro Dennis is a 67 year old male who has been demonstrated to have evidence of premature atherosclerotic vascular disease given history of carotid endarterectomy on the right side in 2017 for severe right ICA stenosis and increased calcium coronary arteries scores. He had x-ray of the right hip joint and was incidentally discovered to have right femoral artery severe calcification.  Doppler study of the lower extremity showed no evidence of flow limitation.           Past Medical History:   Diagnosis Date    Carotid artery stenosis, asymptomatic, right     Cervical disc disorder at C5-C6 level with radiculopathy 02/22/2019    Cervical radiculopathy - s/p corrective surgery, resolved 06/25/2010    HLD (hyperlipidemia)      Past Surgical History:    Procedure Laterality Date    Carotid endarterectomy Right 07/19/2015    INGUINAL HERNIA REPAIR Right 2010    NECK SURGERY  01/2019    for C5-C6 nerve root impingement     ROS    Constitutional symptoms negative  Weight loss a few pounds  GI unremarkable  Respiratory unremarkable  Cardiac as described above  Neurological unremarkable  Arthralgia    Medications     Current Outpatient Medications   Medication Sig    amLODIPine (NORVASC) 5 mg tablet TAKE 1 TABLET (5 MG TOTAL) BY MOUTH DAILY.    rosuvastatin (CRESTOR) 10 mg tablet TAKE 1 TABLET BY MOUTH EVERY DAY    pantoprazole (PROTONIX) 40 MG EC tablet Take 1 tablet (40 mg total) by mouth every morning for 10 days  Swallow whole. Do not crush, break, or chew.    acetaminophen (TYLENOL) 500 mg tablet Take 1,000 mg by mouth every 6 hours as needed    sildenafil (REVATIO) 20 MG tablet Take 40-60 mg by mouth daily as needed      testosterone cypionate (DEPO-TESTOSTERONE) 200 MG/ML injection Inject 400 mg into the muscle  Every 2.5 weeks    aspirin 81 MG EC tablet Take 81 mg by mouth every morning       ibuprofen (ADVIL,MOTRIN) 200 MG tablet Take 600 mg by mouth every 6 hours as needed  Vitals and Physical Exam     Alejandro Dennis's  vitals were not taken for this visit.  There is no height or weight on file to calculate BMI.    Physical Exam  This visit was performed during a pandemic event. The limited physical exam based on observations via phone are as follows: speech is fluid and coherent  No apparent dyspnea by voice. Vitals are stable per patient's records.  They are as follows;  BP: 122/81  Pulse rate:nl  O2 sat: Normal  Weight unchanged  Temp: Afebrile      Laboratory Data     Hematology:   No results found for requested labs within last 730 days.     Chemistry:   Results in Past 730 Days  Result Component Current Result Previous Result   Sodium 140 (10/23/2020) Not in Time Range   Potassium 4.3 (10/23/2020) Not in Time Range   Creatinine 1.15 (10/23/2020) Not in Time  Range   Glucose 106 (H) (10/23/2020) Not in Time Range   Calcium 9.2 (10/23/2020) Not in Time Range   AST 39 (10/23/2020) Not in Time Range   ALT 37 (10/23/2020) Not in Time Range     Coagulation Studies:   No results found for requested labs within last 730 days.     Cardiac:   No results found for requested labs within last 730 days.     Lipids:   Results in Past 730 Days  Result Component Current Result Previous Result   Cholesterol 201 (!) (10/23/2020) Not in Time Range   HDL 82 (H) (10/23/2020) Not in Time Range   Triglycerides 93 (10/23/2020) Not in Time Range   LDL Calculated 100 (10/23/2020) Not in Time Range   Chol/HDL Ratio 2.5 (10/23/2020) Not in Time Range   Recent lab work showed LDL of 86 HDL of 84 total 454 triglycerides 88 and ratio of 2.2.  Cardiac/Imaging Data & Risk Scores     ECG: All prior ECGs and telemetry reviewed         EXERCISE STRESS ECHO LIMITED ([facility] ONLY) 09/02/2018    Interpretation Summary   No evidence of myocardial ischemia by EKG or echo criteria.   The blood pressure response to exercise was hypertensive.   Normal resting LV ejection fraction and wall motion.   The patient's functional capacity is normal.      Cor Ca score 250   50-75 percentile           CV US CAROTID BILATERAL 11/10/2019    Narrative  History:   LICA stenosis, RCEA  Prior studies: 10/20    Right Internal Carotid Artery (ICA)  No significant stenosis (0-15%), stable from prior. Post CEA  Right vertebral artery is antegrade      Left Internal Carotid Artery (ICA)  Mild stenosis (16-49%), stable from prior.  Left vertebral artery is antegrade      * Description of procedure: sonographic evaluation of the bilateral extracranial carotid arteries was performed using B-mode, color and spectral doppler imaging.           Impression and Plan     Patient Active Problem List   Diagnosis Code    R ICA stenosis s/p R CEA 6/28 I65.21    Bilateral carotid artery disease I77.9    Pneumonia due to COVID-19 virus U07.1, J12.82     COVID-19 U07.1    Abnormal EKG R94.31    HLD (hyperlipidemia) E78.5       This is an 67 y.o.  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  He has hypertension and hyperlipidemia as risk factors.  He is status post carotid endarterectomy.  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    He remains asymptomatic from a cardiac standpoint.  Coronary atherosclerosis as evidenced by abnormal coronary calcium score which was about 75th percentile in 2020 now 50-70 5th percentile.  Reviewed the implications of calcium scoring of the coronary arteries being mildly abnormal.  Also reviewed the fact that coronary calcium scoring could increase with advancing age especially with statin therapy.  The fact that he has slight improvement in coronary calcium scoring is a good sign.  I also reviewed the fact that abnormal coronary calcium scan signifies coronary atherosclerosis but the lesions are not site-specific and do not have any correlation with degree of narrowing. He had a normal stress echo and this is reassuring.     Would continue aggressive coronary risk factor modification.     Hyperlipidemia,  He is tolerating rosuvastatin therapy 10 mg daily and this will be continued.  We need to ensure that LDL has improved below 70 range.  Most recent lipid profile demonstrated improving LDL close to our target range.     Hypertension  We need to ensure systolic blood pressure stays below 130 and diastolic below 85.  Blood pressure control is improved since he is on amlodipine therapy and this will be continued.  Encouraged him to continue with low-sodium and DASHdiet.     He is on low-dose aspirin therapy and this will be continued because of documented carotid vessel disease severe requiring endarterectomy.     He was counseled on low fat and low calorie diet.    I have told him that he will need to continue his follow-ups with PCP cardiologist and vascular surgeon..     Thank you for  having allowed me to participate in the care of this pleasant patient.       More than 20-30 minutes was spent in reviewing chart, labs, imaging, documentation, coordinating the above plan by myself on the date of service.         The plan was discussed with the patient and the patient/patient rep demonstrated understanding to the provider's satisfaction.    Consent was previously obtained from the patient to complete this telephone consult; including the potential for financial liability.    21+ minutes was spent on the phone with the patient, patient representatives, and/or other attendees.     I have not scheduled a follow-up appointment but encouraged him to seek one should the need arise.    Thank you for allowing me to participate in the care of this pleasant patient.         Jess Barters Shota Kohrs, MD  Electronically signed on 10/22/2022 at 2:19 PM.

## 2100-09-25 ENCOUNTER — Encounter: Payer: Self-pay | Admitting: Gastroenterology
